# Patient Record
Sex: Male | Born: 1965 | Race: White | Hispanic: No | Smoking: Former smoker
Health system: Southern US, Community
[De-identification: ages and names within clinical notes are randomized; demographics above are authoritative.]

## PROBLEM LIST (undated history)

## (undated) DIAGNOSIS — I2510 Atherosclerotic heart disease of native coronary artery without angina pectoris: Secondary | ICD-10-CM

## (undated) DIAGNOSIS — I10 Essential (primary) hypertension: Secondary | ICD-10-CM

## (undated) DIAGNOSIS — Z8719 Personal history of other diseases of the digestive system: Secondary | ICD-10-CM

## (undated) DIAGNOSIS — F1910 Other psychoactive substance abuse, uncomplicated: Secondary | ICD-10-CM

## (undated) DIAGNOSIS — K219 Gastro-esophageal reflux disease without esophagitis: Secondary | ICD-10-CM

## (undated) DIAGNOSIS — G4733 Obstructive sleep apnea (adult) (pediatric): Secondary | ICD-10-CM

## (undated) DIAGNOSIS — E785 Hyperlipidemia, unspecified: Secondary | ICD-10-CM

## (undated) DIAGNOSIS — Z8659 Personal history of other mental and behavioral disorders: Secondary | ICD-10-CM

## (undated) HISTORY — DX: Atherosclerotic heart disease of native coronary artery without angina pectoris: I25.10

## (undated) HISTORY — DX: Personal history of other mental and behavioral disorders: Z86.59

## (undated) HISTORY — DX: Other psychoactive substance abuse, uncomplicated: F19.10

## (undated) HISTORY — DX: Essential (primary) hypertension: I10

## (undated) HISTORY — DX: Personal history of other diseases of the digestive system: Z87.19

## (undated) HISTORY — PX: CORONARY ARTERY BYPASS GRAFT: SHX141

## (undated) HISTORY — DX: Hyperlipidemia, unspecified: E78.5

---

## 2005-05-23 ENCOUNTER — Emergency Department: Payer: Self-pay | Admitting: Emergency Medicine

## 2005-05-23 ENCOUNTER — Other Ambulatory Visit: Payer: Self-pay

## 2005-06-12 ENCOUNTER — Inpatient Hospital Stay: Payer: Self-pay | Admitting: Internal Medicine

## 2005-06-12 ENCOUNTER — Other Ambulatory Visit: Payer: Self-pay

## 2005-08-15 ENCOUNTER — Ambulatory Visit: Payer: Self-pay | Admitting: Nurse Practitioner

## 2006-01-24 ENCOUNTER — Emergency Department: Payer: Self-pay | Admitting: Emergency Medicine

## 2006-03-25 ENCOUNTER — Ambulatory Visit: Payer: Self-pay | Admitting: Internal Medicine

## 2009-10-25 ENCOUNTER — Inpatient Hospital Stay: Payer: Self-pay | Admitting: Psychiatry

## 2010-01-16 ENCOUNTER — Emergency Department: Payer: Self-pay | Admitting: Unknown Physician Specialty

## 2010-04-18 ENCOUNTER — Inpatient Hospital Stay: Payer: Self-pay | Admitting: Internal Medicine

## 2010-05-04 ENCOUNTER — Ambulatory Visit: Payer: Self-pay | Admitting: Oncology

## 2010-06-04 ENCOUNTER — Ambulatory Visit: Payer: Self-pay | Admitting: Oncology

## 2011-02-19 ENCOUNTER — Emergency Department: Payer: Self-pay | Admitting: Emergency Medicine

## 2011-11-05 DIAGNOSIS — I2510 Atherosclerotic heart disease of native coronary artery without angina pectoris: Secondary | ICD-10-CM

## 2011-11-05 HISTORY — PX: CARDIAC CATHETERIZATION: SHX172

## 2011-11-05 HISTORY — DX: Atherosclerotic heart disease of native coronary artery without angina pectoris: I25.10

## 2011-12-06 ENCOUNTER — Emergency Department: Payer: Self-pay | Admitting: *Deleted

## 2011-12-06 LAB — TSH: Thyroid Stimulating Horm: 1.02 u[IU]/mL

## 2011-12-06 LAB — DRUG SCREEN, URINE

## 2011-12-06 LAB — CBC
HCT: 48.4 % (ref 40.0–52.0)
HGB: 17 g/dL (ref 13.0–18.0)
MCH: 33.5 pg (ref 26.0–34.0)
MCV: 96 fL (ref 80–100)
RBC: 5.07 10*6/uL (ref 4.40–5.90)
WBC: 9.7 10*3/uL (ref 3.8–10.6)

## 2011-12-06 LAB — COMPREHENSIVE METABOLIC PANEL
Alkaline Phosphatase: 94 U/L (ref 50–136)
Anion Gap: 13 (ref 7–16)
BUN: 4 mg/dL — ABNORMAL LOW (ref 7–18)
Bilirubin,Total: 0.3 mg/dL (ref 0.2–1.0)
Chloride: 103 mmol/L (ref 98–107)
Creatinine: 0.54 mg/dL — ABNORMAL LOW (ref 0.60–1.30)
EGFR (African American): >60
Osmolality: 288 (ref 275–301)
Potassium: 3.9 mmol/L (ref 3.5–5.1)
Sodium: 142 mmol/L (ref 136–145)
Total Protein: 8.6 g/dL — ABNORMAL HIGH (ref 6.4–8.2)

## 2011-12-06 LAB — URINALYSIS, COMPLETE
Leukocyte Esterase: NEGATIVE
Ph: 6 (ref 4.5–8.0)
Specific Gravity: 1.001 (ref 1.003–1.030)
Squamous Epithelial: NONE SEEN

## 2011-12-06 LAB — ETHANOL: Ethanol %: 0.295 % — ABNORMAL HIGH (ref 0.000–0.080)

## 2011-12-06 LAB — CK TOTAL AND CKMB (NOT AT ARMC)
CK, Total: 92 U/L (ref 35–232)
CK-MB: 0.9 ng/mL (ref 0.5–3.6)

## 2011-12-06 LAB — LIPASE, BLOOD: Lipase: 91 U/L (ref 73–393)

## 2011-12-16 ENCOUNTER — Emergency Department: Payer: Self-pay | Admitting: *Deleted

## 2011-12-16 LAB — COMPREHENSIVE METABOLIC PANEL
Alkaline Phosphatase: 99 U/L (ref 50–136)
BUN: 6 mg/dL — ABNORMAL LOW (ref 7–18)
Bilirubin,Total: 0.4 mg/dL (ref 0.2–1.0)
Calcium, Total: 9.1 mg/dL (ref 8.5–10.1)
Chloride: 94 mmol/L — ABNORMAL LOW (ref 98–107)
Co2: 30 mmol/L (ref 21–32)
EGFR (African American): >60
Glucose: 305 mg/dL — ABNORMAL HIGH (ref 65–99)
Osmolality: 281 (ref 275–301)
Potassium: 3.1 mmol/L — ABNORMAL LOW (ref 3.5–5.1)
SGOT(AST): 36 U/L (ref 15–37)
SGPT (ALT): 40 U/L
Sodium: 136 mmol/L (ref 136–145)
Total Protein: 9.2 g/dL — ABNORMAL HIGH (ref 6.4–8.2)

## 2011-12-16 LAB — DRUG SCREEN, URINE
Amphetamines, Ur Screen: NEGATIVE (ref ?–1000)
Barbiturates, Ur Screen: NEGATIVE (ref ?–200)
Benzodiazepine, Ur Scrn: NEGATIVE (ref ?–200)
Cannabinoid 50 Ng, Ur ~~LOC~~: NEGATIVE (ref ?–50)
Methadone, Ur Screen: NEGATIVE (ref ?–300)
Opiate, Ur Screen: NEGATIVE (ref ?–300)
Tricyclic, Ur Screen: NEGATIVE (ref ?–1000)

## 2011-12-16 LAB — LIPASE, BLOOD: Lipase: 108 U/L (ref 73–393)

## 2011-12-16 LAB — CBC
HCT: 52.2 % — ABNORMAL HIGH (ref 40.0–52.0)
HGB: 17.7 g/dL (ref 13.0–18.0)
MCH: 32.7 pg (ref 26.0–34.0)
MCHC: 33.9 g/dL (ref 32.0–36.0)
RDW: 14.9 % — ABNORMAL HIGH (ref 11.5–14.5)

## 2011-12-16 LAB — ACETAMINOPHEN LEVEL: Acetaminophen: <2 ug/mL

## 2011-12-16 LAB — SALICYLATE LEVEL: Salicylates, Serum: 3.6 mg/dL — ABNORMAL HIGH

## 2011-12-16 LAB — ETHANOL: Ethanol: 302 mg/dL

## 2012-05-19 ENCOUNTER — Observation Stay: Payer: Self-pay | Admitting: Internal Medicine

## 2012-05-19 LAB — DRUG SCREEN, URINE
Amphetamines, Ur Screen: NEGATIVE (ref ?–1000)
Barbiturates, Ur Screen: NEGATIVE (ref ?–200)
Benzodiazepine, Ur Scrn: NEGATIVE (ref ?–200)
Cannabinoid 50 Ng, Ur ~~LOC~~: NEGATIVE (ref ?–50)
Cocaine Metabolite,Ur ~~LOC~~: NEGATIVE (ref ?–300)
Methadone, Ur Screen: NEGATIVE (ref ?–300)
Opiate, Ur Screen: NEGATIVE (ref ?–300)
Phencyclidine (PCP) Ur S: NEGATIVE (ref ?–25)

## 2012-05-19 LAB — CK TOTAL AND CKMB (NOT AT ARMC)
CK, Total: 73 U/L (ref 35–232)
CK-MB: 0.9 ng/mL (ref 0.5–3.6)

## 2012-05-19 LAB — CBC
MCV: 100 fL (ref 80–100)
Platelet: 214 10*3/uL (ref 150–440)
RDW: 12.5 % (ref 11.5–14.5)
WBC: 10.5 10*3/uL (ref 3.8–10.6)

## 2012-05-19 LAB — COMPREHENSIVE METABOLIC PANEL
Alkaline Phosphatase: 64 U/L (ref 50–136)
Anion Gap: 11 (ref 7–16)
BUN: 15 mg/dL (ref 7–18)
Calcium, Total: 8.8 mg/dL (ref 8.5–10.1)
Chloride: 100 mmol/L (ref 98–107)
Co2: 25 mmol/L (ref 21–32)
EGFR (African American): >60
EGFR (Non-African Amer.): >60
Osmolality: 279 (ref 275–301)
Potassium: 4.1 mmol/L (ref 3.5–5.1)
SGOT(AST): 42 U/L — ABNORMAL HIGH (ref 15–37)
SGPT (ALT): 49 U/L
Total Protein: 6.9 g/dL (ref 6.4–8.2)

## 2012-05-20 DIAGNOSIS — R079 Chest pain, unspecified: Secondary | ICD-10-CM

## 2012-05-20 LAB — HEMOGLOBIN A1C: Hemoglobin A1C: 6.5 % — ABNORMAL HIGH (ref 4.2–6.3)

## 2012-05-20 LAB — TROPONIN I: Troponin-I: <0.02 ng/mL

## 2012-05-20 LAB — CK TOTAL AND CKMB (NOT AT ARMC)
CK, Total: 62 U/L (ref 35–232)
CK-MB: 0.6 ng/mL (ref 0.5–3.6)

## 2012-05-20 LAB — LIPID PANEL
Cholesterol: 273 mg/dL — ABNORMAL HIGH (ref 0–200)
Triglycerides: 2857 mg/dL — ABNORMAL HIGH (ref 0–200)

## 2012-05-21 LAB — COMPREHENSIVE METABOLIC PANEL
Albumin: 3.9 g/dL (ref 3.4–5.0)
Alkaline Phosphatase: 75 U/L (ref 50–136)
BUN: 9 mg/dL (ref 7–18)
Bilirubin,Total: 0.4 mg/dL (ref 0.2–1.0)
Chloride: 101 mmol/L (ref 98–107)
Co2: 22 mmol/L (ref 21–32)
Osmolality: 276 (ref 275–301)
SGOT(AST): 28 U/L (ref 15–37)
SGPT (ALT): 45 U/L

## 2012-05-21 LAB — TROPONIN I: Troponin-I: 0.4 ng/mL — ABNORMAL HIGH

## 2012-05-21 LAB — CBC
HCT: 48 % (ref 40.0–52.0)
HGB: 16.5 g/dL (ref 13.0–18.0)
MCH: 34.3 pg — ABNORMAL HIGH (ref 26.0–34.0)
MCHC: 34.4 g/dL (ref 32.0–36.0)
MCV: 100 fL (ref 80–100)
RBC: 4.81 10*6/uL (ref 4.40–5.90)

## 2012-05-22 ENCOUNTER — Inpatient Hospital Stay: Payer: Self-pay | Admitting: Internal Medicine

## 2012-05-22 DIAGNOSIS — R748 Abnormal levels of other serum enzymes: Secondary | ICD-10-CM

## 2012-05-22 DIAGNOSIS — I2510 Atherosclerotic heart disease of native coronary artery without angina pectoris: Secondary | ICD-10-CM

## 2012-05-22 DIAGNOSIS — R079 Chest pain, unspecified: Secondary | ICD-10-CM

## 2012-05-22 LAB — CK-MB: CK-MB: 7 ng/mL — ABNORMAL HIGH (ref 0.5–3.6)

## 2012-05-22 LAB — CK TOTAL AND CKMB (NOT AT ARMC)
CK, Total: 166 U/L (ref 35–232)
CK-MB: 15.4 ng/mL — ABNORMAL HIGH (ref 0.5–3.6)

## 2012-05-22 LAB — APTT: Activated PTT: 35.2 secs (ref 23.6–35.9)

## 2012-05-23 LAB — CBC WITH DIFFERENTIAL/PLATELET
Basophil #: 0.1 10*3/uL (ref 0.0–0.1)
Eosinophil %: 3.1 %
HCT: 40.7 % (ref 40.0–52.0)
HGB: 14.1 g/dL (ref 13.0–18.0)
Lymphocyte #: 1.9 10*3/uL (ref 1.0–3.6)
Lymphocyte %: 25.7 %
Monocyte #: 0.6 x10 3/mm (ref 0.2–1.0)
Monocyte %: 8.1 %
Neutrophil #: 4.4 10*3/uL (ref 1.4–6.5)
Platelet: 161 10*3/uL (ref 150–440)
RBC: 4.07 10*6/uL — ABNORMAL LOW (ref 4.40–5.90)
RDW: 12.4 % (ref 11.5–14.5)

## 2012-05-23 LAB — BASIC METABOLIC PANEL
BUN: 6 mg/dL — ABNORMAL LOW (ref 7–18)
Calcium, Total: 8.4 mg/dL — ABNORMAL LOW (ref 8.5–10.1)
Chloride: 103 mmol/L (ref 98–107)
Co2: 24 mmol/L (ref 21–32)
EGFR (African American): >60
EGFR (Non-African Amer.): >60
Glucose: 156 mg/dL — ABNORMAL HIGH (ref 65–99)
Potassium: 3.4 mmol/L — ABNORMAL LOW (ref 3.5–5.1)
Sodium: 137 mmol/L (ref 136–145)

## 2012-05-23 LAB — PROTIME-INR
INR: 0.8
Prothrombin Time: 11.6 secs (ref 11.5–14.7)

## 2012-06-26 ENCOUNTER — Observation Stay: Payer: Self-pay | Admitting: Internal Medicine

## 2012-06-26 DIAGNOSIS — R079 Chest pain, unspecified: Secondary | ICD-10-CM

## 2012-06-26 DIAGNOSIS — I517 Cardiomegaly: Secondary | ICD-10-CM

## 2012-06-26 LAB — CBC
MCH: 33.1 pg (ref 26.0–34.0)
MCV: 97 fL (ref 80–100)
Platelet: 415 10*3/uL (ref 150–440)
RBC: 4.17 10*6/uL — ABNORMAL LOW (ref 4.40–5.90)
RDW: 13.3 % (ref 11.5–14.5)

## 2012-06-26 LAB — CK TOTAL AND CKMB (NOT AT ARMC)
CK, Total: 62 U/L (ref 35–232)
CK, Total: 52 U/L (ref 35–232)
CK-MB: 1.2 ng/mL (ref 0.5–3.6)

## 2012-06-26 LAB — BASIC METABOLIC PANEL
Anion Gap: 10 (ref 7–16)
BUN: 9 mg/dL (ref 7–18)
Creatinine: 0.78 mg/dL (ref 0.60–1.30)
EGFR (Non-African Amer.): >60
Osmolality: 277 (ref 275–301)
Potassium: 3.9 mmol/L (ref 3.5–5.1)

## 2012-06-26 LAB — TROPONIN I: Troponin-I: <0.02 ng/mL

## 2012-06-29 ENCOUNTER — Encounter: Payer: Self-pay | Admitting: *Deleted

## 2012-06-29 ENCOUNTER — Encounter: Payer: Self-pay | Admitting: Cardiovascular Disease

## 2012-07-07 ENCOUNTER — Encounter: Payer: PRIVATE HEALTH INSURANCE | Admitting: Cardiovascular Disease

## 2012-08-06 ENCOUNTER — Encounter: Payer: Self-pay | Admitting: Cardiovascular Disease

## 2012-10-08 ENCOUNTER — Emergency Department: Payer: Self-pay | Admitting: Emergency Medicine

## 2012-11-14 ENCOUNTER — Emergency Department: Payer: Self-pay | Admitting: Emergency Medicine

## 2012-11-14 ENCOUNTER — Emergency Department: Payer: Self-pay | Admitting: Unknown Physician Specialty

## 2012-11-14 LAB — DRUG SCREEN, URINE
Amphetamines, Ur Screen: NEGATIVE (ref ?–1000)
Benzodiazepine, Ur Scrn: NEGATIVE (ref ?–200)
Cocaine Metabolite,Ur ~~LOC~~: NEGATIVE (ref ?–300)
Methadone, Ur Screen: NEGATIVE (ref ?–300)
Phencyclidine (PCP) Ur S: NEGATIVE (ref ?–25)
Tricyclic, Ur Screen: NEGATIVE (ref ?–1000)

## 2012-11-14 LAB — COMPREHENSIVE METABOLIC PANEL
Alkaline Phosphatase: 91 U/L (ref 50–136)
Anion Gap: 11 (ref 7–16)
BUN: 14 mg/dL (ref 7–18)
Chloride: 107 mmol/L (ref 98–107)
Co2: 23 mmol/L (ref 21–32)
Creatinine: 0.59 mg/dL — ABNORMAL LOW (ref 0.60–1.30)
EGFR (Non-African Amer.): >60
Glucose: 160 mg/dL — ABNORMAL HIGH (ref 65–99)
Osmolality: 285 (ref 275–301)
Potassium: 4 mmol/L (ref 3.5–5.1)
SGOT(AST): 27 U/L (ref 15–37)

## 2012-11-14 LAB — URINALYSIS, COMPLETE
Glucose,UR: NEGATIVE mg/dL (ref 0–75)
Leukocyte Esterase: NEGATIVE
Nitrite: NEGATIVE
RBC,UR: NONE SEEN /HPF (ref 0–5)
Specific Gravity: 1.021 (ref 1.003–1.030)
Squamous Epithelial: <1

## 2012-11-14 LAB — CBC
MCH: 31.6 pg (ref 26.0–34.0)
MCHC: 33.8 g/dL (ref 32.0–36.0)
MCV: 94 fL (ref 80–100)
RBC: 4.92 10*6/uL (ref 4.40–5.90)
RDW: 14 % (ref 11.5–14.5)
WBC: 15.7 10*3/uL — ABNORMAL HIGH (ref 3.8–10.6)

## 2012-11-14 LAB — TSH: Thyroid Stimulating Horm: 0.65 u[IU]/mL

## 2012-11-14 LAB — ETHANOL: Ethanol: 173 mg/dL

## 2013-06-27 ENCOUNTER — Observation Stay: Payer: Self-pay | Admitting: Internal Medicine

## 2013-06-27 LAB — DRUG SCREEN, URINE
Barbiturates, Ur Screen: NEGATIVE (ref ?–200)
Benzodiazepine, Ur Scrn: NEGATIVE (ref ?–200)
Cannabinoid 50 Ng, Ur ~~LOC~~: NEGATIVE (ref ?–50)
Cocaine Metabolite,Ur ~~LOC~~: NEGATIVE (ref ?–300)
MDMA (Ecstasy)Ur Screen: NEGATIVE (ref ?–500)
Methadone, Ur Screen: NEGATIVE (ref ?–300)
Tricyclic, Ur Screen: NEGATIVE (ref ?–1000)

## 2013-06-27 LAB — COMPREHENSIVE METABOLIC PANEL
Alkaline Phosphatase: 127 U/L (ref 50–136)
Anion Gap: 9 (ref 7–16)
BUN: 8 mg/dL (ref 7–18)
Bilirubin,Total: 0.5 mg/dL (ref 0.2–1.0)
Calcium, Total: 8.8 mg/dL (ref 8.5–10.1)
Chloride: 95 mmol/L — ABNORMAL LOW (ref 98–107)
EGFR (African American): >60
Glucose: 312 mg/dL — ABNORMAL HIGH (ref 65–99)
SGOT(AST): 21 U/L (ref 15–37)
Sodium: 128 mmol/L — ABNORMAL LOW (ref 136–145)

## 2013-06-27 LAB — CBC
HCT: 51.2 % (ref 40.0–52.0)
HGB: 18.2 g/dL — ABNORMAL HIGH (ref 13.0–18.0)
MCV: 91 fL (ref 80–100)
Platelet: 316 10*3/uL (ref 150–440)
RBC: 5.61 10*6/uL (ref 4.40–5.90)
RDW: 13.1 % (ref 11.5–14.5)
WBC: 18.9 10*3/uL — ABNORMAL HIGH (ref 3.8–10.6)

## 2013-06-27 LAB — URINALYSIS, COMPLETE
Bilirubin,UR: NEGATIVE
Blood: NEGATIVE
Glucose,UR: 300 mg/dL (ref 0–75)
Hyaline Cast: 2
Nitrite: NEGATIVE
RBC,UR: 1 /HPF (ref 0–5)
WBC UR: 3 /HPF (ref 0–5)

## 2013-06-27 LAB — ETHANOL
Ethanol %: <0.003 % (ref 0.000–0.080)
Ethanol: <3 mg/dL

## 2013-06-27 LAB — CK TOTAL AND CKMB (NOT AT ARMC)
CK, Total: 77 U/L (ref 35–232)
CK-MB: 1.4 ng/mL (ref 0.5–3.6)

## 2013-06-27 LAB — ACETAMINOPHEN LEVEL: Acetaminophen: <2 ug/mL

## 2013-06-28 LAB — CBC WITH DIFFERENTIAL/PLATELET
Basophil #: 0.1 10*3/uL (ref 0.0–0.1)
Basophil %: 1 %
Eosinophil %: 0.8 %
HCT: 46.8 % (ref 40.0–52.0)
Lymphocyte #: 2 10*3/uL (ref 1.0–3.6)
Lymphocyte %: 15.3 %
MCHC: 36 g/dL (ref 32.0–36.0)
MCV: 91 fL (ref 80–100)
Monocyte %: 6.1 %
Neutrophil #: 9.9 10*3/uL — ABNORMAL HIGH (ref 1.4–6.5)
Neutrophil %: 76.8 %
Platelet: 246 10*3/uL (ref 150–440)
RBC: 5.17 10*6/uL (ref 4.40–5.90)
WBC: 12.8 10*3/uL — ABNORMAL HIGH (ref 3.8–10.6)

## 2013-06-28 LAB — BASIC METABOLIC PANEL
Anion Gap: 9 (ref 7–16)
Calcium, Total: 8.6 mg/dL (ref 8.5–10.1)
Chloride: 98 mmol/L (ref 98–107)
Co2: 25 mmol/L (ref 21–32)
EGFR (African American): >60
EGFR (Non-African Amer.): >60
Osmolality: 275 (ref 275–301)
Sodium: 132 mmol/L — ABNORMAL LOW (ref 136–145)

## 2013-06-28 LAB — TROPONIN I
Troponin-I: 0.29 ng/mL — ABNORMAL HIGH
Troponin-I: 0.23 ng/mL — ABNORMAL HIGH

## 2013-06-28 LAB — CK TOTAL AND CKMB (NOT AT ARMC)
CK, Total: 53 U/L (ref 35–232)
CK-MB: 0.9 ng/mL (ref 0.5–3.6)

## 2014-04-30 LAB — URINALYSIS, COMPLETE
BILIRUBIN, UR: NEGATIVE
Bacteria: NONE SEEN
Blood: NEGATIVE
Hyaline Cast: 2
LEUKOCYTE ESTERASE: NEGATIVE
Nitrite: NEGATIVE
Ph: 5 (ref 4.5–8.0)
RBC,UR: 2 /HPF (ref 0–5)
Specific Gravity: 1.025 (ref 1.003–1.030)
Squamous Epithelial: NONE SEEN

## 2014-04-30 LAB — DRUG SCREEN, URINE

## 2014-04-30 LAB — CBC
HCT: 50.2 % (ref 40.0–52.0)
HGB: 16.7 g/dL (ref 13.0–18.0)
MCH: 30.9 pg (ref 26.0–34.0)
MCHC: 33.2 g/dL (ref 32.0–36.0)
MCV: 93 fL (ref 80–100)
PLATELETS: 226 10*3/uL (ref 150–440)
RBC: 5.4 10*6/uL (ref 4.40–5.90)
RDW: 13 % (ref 11.5–14.5)
WBC: 15.6 10*3/uL — AB (ref 3.8–10.6)

## 2014-04-30 LAB — COMPREHENSIVE METABOLIC PANEL
ALBUMIN: 4.6 g/dL (ref 3.4–5.0)
ALK PHOS: 78 U/L
ANION GAP: 11 (ref 7–16)
AST: 20 U/L (ref 15–37)
BUN: 11 mg/dL (ref 7–18)
Bilirubin,Total: 0.5 mg/dL (ref 0.2–1.0)
CHLORIDE: 104 mmol/L (ref 98–107)
CO2: 24 mmol/L (ref 21–32)
Calcium, Total: 9.1 mg/dL (ref 8.5–10.1)
Creatinine: 0.58 mg/dL — ABNORMAL LOW (ref 0.60–1.30)
EGFR (African American): >60
EGFR (Non-African Amer.): >60
Glucose: 214 mg/dL — ABNORMAL HIGH (ref 65–99)
OSMOLALITY: 283 (ref 275–301)
POTASSIUM: 3.5 mmol/L (ref 3.5–5.1)
SGPT (ALT): 40 U/L (ref 12–78)
Sodium: 139 mmol/L (ref 136–145)
Total Protein: 7.7 g/dL (ref 6.4–8.2)

## 2014-04-30 LAB — SALICYLATE LEVEL: Salicylates, Serum: 2.4 mg/dL

## 2014-04-30 LAB — ACETAMINOPHEN LEVEL: Acetaminophen: <2 ug/mL

## 2014-04-30 LAB — ETHANOL
ETHANOL LVL: 169 mg/dL
Ethanol %: 0.169 % — ABNORMAL HIGH (ref 0.000–0.080)

## 2014-05-02 ENCOUNTER — Inpatient Hospital Stay: Payer: Self-pay | Admitting: Internal Medicine

## 2014-05-02 LAB — BASIC METABOLIC PANEL
Anion Gap: 14 (ref 7–16)
BUN: 14 mg/dL (ref 7–18)
CALCIUM: 8.8 mg/dL (ref 8.5–10.1)
CO2: 26 mmol/L (ref 21–32)
Chloride: 98 mmol/L (ref 98–107)
Creatinine: 0.59 mg/dL — ABNORMAL LOW (ref 0.60–1.30)
EGFR (African American): >60
GLUCOSE: 397 mg/dL — AB (ref 65–99)
Osmolality: 293 (ref 275–301)
POTASSIUM: 4.2 mmol/L (ref 3.5–5.1)
SODIUM: 138 mmol/L (ref 136–145)

## 2014-05-02 LAB — TROPONIN I

## 2014-05-03 LAB — BASIC METABOLIC PANEL
Anion Gap: 6 — ABNORMAL LOW (ref 7–16)
BUN: 14 mg/dL (ref 7–18)
CALCIUM: 8.5 mg/dL (ref 8.5–10.1)
CHLORIDE: 105 mmol/L (ref 98–107)
CO2: 26 mmol/L (ref 21–32)
Creatinine: 0.69 mg/dL (ref 0.60–1.30)
EGFR (Non-African Amer.): >60
Glucose: 264 mg/dL — ABNORMAL HIGH (ref 65–99)
OSMOLALITY: 283 (ref 275–301)
Potassium: 4 mmol/L (ref 3.5–5.1)
SODIUM: 137 mmol/L (ref 136–145)

## 2014-05-03 LAB — LIPID PANEL
CHOLESTEROL: 105 mg/dL (ref 0–200)
HDL: 22 mg/dL — AB (ref 40–60)
Triglycerides: 409 mg/dL — ABNORMAL HIGH (ref 0–200)

## 2014-05-03 LAB — URINALYSIS, COMPLETE
BACTERIA: NONE SEEN
BLOOD: NEGATIVE
Bilirubin,UR: NEGATIVE
Glucose,UR: >=500 mg/dL (ref 0–75)
KETONE: NEGATIVE
Leukocyte Esterase: NEGATIVE
Nitrite: NEGATIVE
Ph: 6 (ref 4.5–8.0)
Protein: NEGATIVE
RBC,UR: NONE SEEN /HPF (ref 0–5)
Specific Gravity: 1.028 (ref 1.003–1.030)
Squamous Epithelial: NONE SEEN
WBC UR: NONE SEEN /HPF (ref 0–5)

## 2014-05-03 LAB — MAGNESIUM: MAGNESIUM: 1.9 mg/dL

## 2014-05-03 LAB — TSH: Thyroid Stimulating Horm: 3.49 u[IU]/mL

## 2014-05-04 LAB — PLATELET COUNT: Platelet: 205 x10 3/mm 3

## 2015-02-21 NOTE — Consult Note (Signed)
Brief Consult Note: Diagnosis: muscloskeletal chest pain, recent RCA PCI and CABG.   Patient was seen by consultant.   Consult note dictated.   Comments: The chest pain seems noncardiac. Will check an echo to make sure no pericardial effusion post CABG.  Given a recent drug eluting stent placement to RCA, I recommend resuming Plavix.  If echo is ok, the patient can be discharged home. [facility].  Electronic Signatures: Lorine BearsArida, Muhammad (MD)  (Signed 23-Aug-13 08:49)  Authored: Brief Consult Note   Last Updated: 23-Aug-13 08:49 by Lorine BearsArida, Muhammad (MD)

## 2015-02-21 NOTE — Discharge Summary (Signed)
PATIENT NAME:  Rodney [name], Rodney [name] MR#:  [id number] DATE OF BIRTH:  1965-12-12  DATE OF ADMISSION:  06/26/2012 DATE OF DISCHARGE:  06/26/2012  PRIMARY CARE PHYSICIAN: None local.   CONSULTING PHYSICIAN: Dr. Kirke CorinArida   DISCHARGE DIAGNOSES:  1. Atypical chest pain. 2. Coronary artery disease. 3. Hypertension.  4. Diabetes.   HOME MEDICATIONS:  1. Trazodone 50 mg p.o. daily at bedtime at bedtime. 2. Insulin 70/30 40 units sub-Q twice daily.  3. Gemfibrozil 600 mg p.o. b.i.[name]. before meals.  4. Hydralazine 50 mg p.o. 4 times a day.  5. Lisinopril 20 mg p.o. daily.  6. Atorvastatin 20 mg p.o. at bedtime.  7. Lopressor 50 mg p.o. b.i.[name].  8. Aspirin 81 mg p.o. 2 tablets p.o. once daily.   NEW MEDICATIONS:  1. Plavix 75 mg p.o. daily.  2. Lasix 20 mg p.o. daily.   DIET: Low sodium, low fat, low cholesterol ADA diet.   ACTIVITY: As tolerated.   FOLLOW-UP CARE:  1. Follow-up with PCP within 1 to 2 weeks.  2. Follow-up with Dr. Kirke CorinArida within 1 to 2 weeks.   HOSPITAL COURSE: The patient is a 49 year old gentleman with history of hypertension, diabetes, and coronary artery disease status post [facility] who presented to the ED with chest pain for 24 hours. The patient also had some mild leg edema. For a detailed history and physical examination, please refer to the admission note dictated by Dr. Tilda FrancoAkuneme.  On admission date, the patient's EKG showed sinus tachycardia at 110. White count 11, hemoglobin 14, BUN 9, creatinine 0.7, troponin less than 0.02 and the second one was less than 0.02.  Dr. Kirke CorinArida evaluated the patient and suggested echocardiogram which showed normal ejection fraction. Dr. Kirke CorinArida suggested the patient needs to take Plavix and may be discharged and [facility]. The patient had no chest pain after admission and no other symptoms. The patient's vital signs are stable. He is clinically stable and will be discharged to home today.   I  discussed the patient's discharge plan with the patient, nurse, case manager, and Dr. Kirke CorinArida.   TIME SPENT: About 37 minutes.  ____________________________ Shaune PollackQing Karthikeya Funke, MD qc:drc [name]: 06/26/2012 21:56:37 ET T: 06/27/2012 09:49:16 ET JOB#: [id number]  cc: Shaune PollackQing Musa Rewerts, MD, <Dictator> Shaune PollackQING Brayln Duque MD ELECTRONICALLY SIGNED 06/30/2012 16:44

## 2015-02-21 NOTE — H&P (Signed)
PATIENT NAME:  Rodney [name], Rodney [name] MR#:  [id number] DATE OF BIRTH:  1966-07-06  DATE OF ADMISSION:  06/26/2012  [facility]. ER PHYSICIAN: Dr. Manson PasseyBrown.  ADMITTING PHYSICIAN: Dr. Tilda FrancoAkuneme.    PRESENTING COMPLAINT: Chest pain.   HISTORY OF PRESENT ILLNESS: The patient is a 49 year old man who presented with complaint of chest pain that has been ongoing for the last 24 hours. Of note, the patient stated that he went to look over a camper he was supposed to buy and opened the camper, looked at a snake in there and jumped off the camper for safety. Since then he has been having recurrent chest pain, worse with lying down. He denies any loss of consciousness. No PND or orthopnea. Has mild leg swelling left lower extremity. No episodes of long distance travel or recent sick contacts. Of note, the patient recently, two weeks ago, had three-v[facility] and has been doing very well until episode of chest pain as stated earlier. One month ago had cardiac catheterization with drug-eluting stent placement in RCA. Denies any fever or PND, but has orthopnea. Unable to lie down due to chest pain and shortness of breath every time he lies down. The patient claims compliance with his medication. For this he was seen in the Emergency Room here and referred to the hospitalist for further evaluation. Work-up included an EKG which showed sinus arrhythmia and cardiac enzymes, which has been negative so far.   REVIEW OF SYSTEMS: CONSTITUTIONAL: Denies fever, weakness, weight loss or weight gain. EYES: No blurred vision, discharge, or redness. ENT: No tinnitus, epistaxis, difficulty swallowing or redness of oropharynx. RESPIRATORY: Admits to some cough. Denies any wheezing. Cough is nonproductive. Denies any shortness of breath. CARDIOVASCULAR: Positive for chest pain, sharp in nature, intensity 8/10, nonradiating. Denies any palpitations, syncope, arrhythmias,  exertional dyspnea. He has edema of left lower extremity from the venous graft access. GASTROINTESTINAL: No nausea, vomiting, diarrhea, abdominal pain, change in bowel habits. GU: No dysuria, frequency, or incontinence. ENDOCRINE: No polyuria, polydipsia, heat or cold intolerance. HEMATOLOGIC: No anemia, easy bruising, bleeding. No swollen glands. SKIN: No rashes, change in hair or skin texture. MUSCULOSKELETAL: Denies any joint pain, redness, swelling, but has tenderness anterior chest wall. NEUROLOGIC: No numbness, dizziness, memory loss, or headaches. PSYCHIATRIC: No anxiety or depression.   PAST MEDICAL HISTORY:  1. Type 2 diabetes.  2. History of depression.  3. Polysubstance abuse. 4. Prior history of noncompliance to medications.  5. History of coronary artery disease status post stent placement in RCA one month ago and status post coronary artery [facility].  6. Obstructive sleep apnea.  7. Hyperlipidemia.  8. Triglyceridemia with prior history of pancreatitis. 9. History of hypertension.   PAST SURGICAL HISTORY:  1. Cardiac stent placement in 2006. A second stent placement and cardiac catheterization in July 2013. Drug-eluting stent placed in 90% RCA lesion. During that time he was noted to have moderate LAD lesion of 50-60% and subsequently two weeks ago had coronary artery [facility]  2. Prior history of abdominal surgery, cannot recall for what.   SOCIAL HISTORY: Lives at home. Reformed tobacco abuser, quit about four weeks ago, 30 pack-year history. He denies any other recreational drug use.   FAMILY HISTORY: Positive for diabetes in both parents. Alcohol abuse in the father.   ALLERGIES: Zinc including insulin.   MEDICATIONS:  1. Aspirin 81 mg 2  tablets once daily.  2. Lisinopril 20 mg once daily.  3. Trazodone 50 mg at bedtime. 4. Insulin 70/30, forty units subcutaneous twice a day. 5. Atorvastatin 20 mg at  bedtime. 6. Gemfibrozil 600 mg twice daily.  7. Lopressor 50 mg twice a day. 8. Hydralazine 50 mg 4 times daily.   PHYSICAL EXAMINATION:  VITAL SIGNS: Temperature 98.7, pulse 114 on arrival, now is 85, respiratory rate 22. Blood pressure on arrival 175/86, now is 144/95, oxygen saturation 95% on room air.   GENERAL: Overweight male sitting up on gurney, awake, alert, oriented to time, place, and person, in no overt distress.   HEENT: Atraumatic, normocephalic. Pupils equal, reactive to light and accommodation. Extraocular movement intact. Mucous membranes pink, moist.   NECK: Supple. No JV distention.   CHEST: Good air entry. Generalized rhonchi, no rales. Tenderness anterior chest wall, mostly left-sided.  HEART: Regular rate and rhythm No murmurs.   ABDOMEN: Full, moves with respiration, nontender. Bowel sounds normoactive. No organomegaly.   EXTREMITIES: Edema left lower extremity. Normal range of movement.   NEUROLOGIC: Cranial nerves II through XII grossly intact. No focal motor or sensory deficits.   PSYCH: Affect appropriate to situation.   SKIN: He has median sternotomy scar on his anterior chest wall and laparoscopic scars upper quadrants of the abdomen.   LABORATORY, DIAGNOSTIC, AND RADIOLOGICAL DATA: EKG shows sinus tachycardia, rate of 110. multifocal atrial tachycardia with P wave inversions. There are no T wave changes. Repeat EKG shows sinus arrhythmia, rate of 70. CBC: White count 11, hemoglobin approximately 14, platelets 415. Chemistry: Sodium 134, potassium 3.9, creatinine 0.7, BUN 9, glucose 287, calcium 9. CK 62. Troponin negative.   IMPRESSION:  1. Chest pain, rule out ACS versus arrhythmias, most likely musculoskeletal. Could consider post sternotomy syndrome.  2. History of coronary artery disease, status post recent coronary artery bypass graft two weeks ago and recent stent one month ago noted.  3. History of hypertension.  4. Type 2 diabetes, poorly  controlled.  5. Leukocytosis, not otherwise specified, likely reactive.  6. Depression, stable.  7. Tobacco misuse, now discontinued.  8. Hyperlipidemia.  9. Obstructive sleep apnea.   PLAN:  1. Admit to general medical floor for serial cardiac enzymes, telemonitoring, for respiratory support with oxygen, nebulizer.  2. Cardiology evaluation in a.m.  3. Gastrointestinal prophylaxis with Protonix.  4. Deep vein thrombosis prophylaxis with Lovenox.   CODE STATUS: FULL CODE.   TOTAL PATIENT CARE TIME: 50 minutes.   ____________________________ Floy Sabina Tilda Franco, MD mia:ap [name]: 06/26/2012 04:05:15 ET T: 06/26/2012 07:34:29 ET JOB#: [id number]  cc: Milas Schappell I. Tilda Franco, MD, <Dictator> [facility] Margaret Pyle MD ELECTRONICALLY SIGNED 06/27/2012 1:50

## 2015-02-21 NOTE — Discharge Summary (Signed)
PATIENT NAME:  Dionne BucyVINCENT, Tahmir D MR#:  [id number] DATE OF BIRTH:  [birth year]  DATE OF ADMISSION:  05/19/2012 DATE OF DISCHARGE:  05/20/2012  PRIMARY CARE PHYSICIAN:  [facility]- he does not know his physician's name.  FINAL DIAGNOSES:  1. Chest pain.  2. History of coronary artery disease.  3. Malignant hypertension.  4. Diabetes.  5. Hypertriglyceridemia.  6. Bronchitis.  MEDICATIONS ON DISCHARGE:  1. Thiamine 100 mg daily.  2. Trazodone 50 mg at bedtime.  3. Xanax 0.5 mg twice a day as needed for anxiety.  4. Lopressor 50 mg twice a day. 5. Aspirin 325 mg daily.  6. Lisinopril 20 mg daily.  7. 70/30 insulin, 40 units subcutaneous injection twice a day. 8. Gemfibrozil 600 mg twice a day.  9. Plavix 75 mg daily.  10. Zithromax 250 mg, 1 tablet daily for four more days, then stop.  11. Hydralazine 50 mg 4 times a day.   DIET: Low sodium diet, low fat diet, low cholesterol diet, regular consistency.   ACTIVITY: As tolerated.   FOLLOWUP: [facility].   The patient was admitted 05/19/2012, discharged 05/20/2012.   CHIEF COMPLAINT: Chest pain.   HISTORY OF PRESENT ILLNESS: The patient is a 49 year old man with hypertension and diabetes who came in with chest pain, had a stent placed in 2006. Chest pain lasted an hour, 7 out of 10 in intensity, pressure-like. He was admitted to the hospital. Cardiac enzymes were ordered. Stress test was ordered. For his malignant hypertension he was restarted on beta blocker, hydralazine, and switched over to lisinopril.   LABORATORY, DIAGNOSTIC, AND RADIOLOGICAL DATA: EKG showed sinus tachycardia, poor R-wave progression, flattening of T waves laterally.  Glucose 209, BUN 15, creatinine 0.55, sodium 136, potassium 4.1, chloride 100, CO2 25, calcium 8.8. Liver function tests: AST slightly elevated at 42. Troponin negative. White blood cell count 10.5, hemoglobin and hematocrit 15.8 and 45.1, platelet count of 214. Chest x-ray:  Mild  interstitial prominence, mild interstitial edema or atypical infection are considerations.  Urine toxicology negative. Troponin negative. Triglycerides 2857, HDL 22, hemoglobin A1c 6.5, third troponin negative. Stress test showed ejection fraction of 47%. No significant ischemia. Old scar apical region. Normal wall motion. Low risk scan.   The patient was discharged home in stable condition.   HOSPITAL COURSE PER PROBLEM LIST:  1. Chest pain: The patient had no further chest pain. Stress test was negative. Cardiac enzymes were negative. The patient was discharged home, kept on his aspirin, Plavix, and metoprolol.  2. History of coronary artery disease: Kept on aspirin, Plavix, and metoprolol.  3. Malignant hypertension: Blood pressure was variable during the hospital course. Blood pressure 133/83 upon discharge. He was kept on his metoprolol, hydralazine, and lisinopril.  4. Diabetes: He was put back on his 70/30 insulin, 40 units subcutaneous injection twice a day.  5. Hypertriglyceridemia: He was prescribed gemfibrozil. Triglycerides greater than 2800.  6. Bronchitis: Dr. Luberta MutterKonidena put the patient on a Z-Pak. Will continue and finish out the course.   TIME SPENT ON DISCHARGE: 35 minutes.   ____________________________ Herschell Dimesichard J. Renae GlossWieting, MD rjw:bjt D:  05/20/2012 16:01:10 ET         T: 05/21/2012 11:20:39 ET         JOB#: [id number]  cc: [facility] Salley ScarletICHARD J Akane Tessier MD ELECTRONICALLY SIGNED 05/23/2012 16:04

## 2015-02-21 NOTE — H&P (Signed)
PATIENT NAME:  Rodney BucyVINCENT, Rodney [name] MR#:  [id number] DATE OF BIRTH:  03/08/1966  DATE OF ADMISSION:  05/22/2012  PRIMARY CARE PHYSICIAN: None  ER PHYSICIAN: Dr. Bayard Malesandolph Brown  CHIEF COMPLAINT: Chest pain.   HISTORY OF PRESENT ILLNESS: 49 year old male patient with history of hypertension, diabetes who recently had a stress test done on 05/20/2012 for a similar chest pain which was retrosternal, presents to the Emergency Room with the same complaints. Patient has history of coronary artery disease with his last cardiac catheterization and stent placed in 2006. Patient had this chest pain start three days prior which was retrosternal, pressure-like and presented to the Emergency Room for the same. Today this pain was worse, radiation to the neck and he presented to the Emergency Room again, was found to have troponin of 0.4. This is not associated with any shortness of breath, palpitations, lightheadedness but does have significant diaphoresis. No nausea, vomiting, abdominal pain or any bleeding.   His stress test done on 05/20/2012 showed no significant ischemia, thinning of the apical region with possible old scar versus attenuation defect, 47% ejection fraction and no EKG changes concerning for ischemia.   PAST MEDICAL HISTORY: 1. Hypertension. 2. Diabetes. 3. Hypertriglyceridemia. 4. Diabetic neuropathy. 5. Coronary artery disease with stent placed in 2006. 6. Pancreatitis. 7. Tobacco abuse   ALLERGIES: Zinc and zinc-containing medications including insulin.   SOCIAL HISTORY: Patient has a 30 pack-year smoking history, is cutting down his smoking at this time. No alcohol. No drugs.   PAST SURGICAL HISTORY:  1. Cardiac catheterization. 2. Abdominal surgery.   FAMILY HISTORY: Diabetes in mother and father. His father also had problems with alcoholism.   DISCHARGE MEDICATIONS:  1. Aspirin 81 mg oral once a day.  2. Plavix 75 mg oral once a day. 3. Nexium 100 mg oral once a day.   4. Lopressor 50 mg oral 2 times a day.  5. Alprazolam 0.5 mg oral 2 times a day.  6. Erythromycin 250 mg oral once a day.  7. Gemfibrozil 600 mg oral once a day.  8. Hydralazine 50 mg oral 4 times a day.  9. Insulin 70/30, 40 units twice a day.  10. Lisinopril 20 mg oral once a day.  11. Trazodone 50 mg oral once a day.   REVIEW OF THE SYSTEMS: CONSTITUTIONAL: No fever, fatigue or weakness. EYES: No blurred vision or redness. ENT no tinnitus, ear pain, hearing loss. RESPIRATORY: No cough. No wheezing. No hemoptysis. CARDIOVASCULAR: Chest pain present. No orthopnea or edema. GASTROINTESTINAL: No nausea, vomiting, diarrhea, abdominal pain. GENITOURINARY: No dysuria, hematuria. ENDOCRINE: No polyuria or nocturia. HEME/LYMPH: No anemia, easy bruising or bleeding. SKIN: No rash, ulcers. MUSCULOSKELETAL: No joint swelling, redness, effusion. NEUROLOGICAL: No numbness or focal weakness. PSYCHIATRIC: Does have problems with anxiety and panic attacks.   PHYSICAL EXAMINATION:  VITAL SIGNS: Blood pressure 175/103, pulse 98, temperature 98.2, respirations 18, saturating 98% on room air.   GENERAL: Obese Caucasian male patient sitting up in bed comfortable, in no acute distress.   PSYCHIATRIC: Alert and oriented x3. Mood and affect appropriate. Judgment intact.   HEENT: Atraumatic, normocephalic. Oral mucosa moist and pink. External ears and nose normal. No pallor. No icterus. Pupils bilaterally equal and reactive to light.   NECK: Supple. No thyromegaly. No palpable lymph nodes. Trachea midline. No carotid bruit or JVD.   CARDIOVASCULAR: S1, S2, regular rate and rhythm without any murmurs. PMI not displaced. No chest wall tenderness.   RESPIRATORY: No work of breathing. Clear to  auscultation on both sides. Normal percussion.   GASTROINTESTINAL: Soft abdomen, nontender. Bowel sounds present. No hepatosplenomegaly or hernias.   SKIN: Warm and dry. No petechiae, rash, ulcers.   EXTREMITIES: No  cyanosis, clubbing, edema.   NEUROLOGICAL: Motor strength 5/5 in upper and lower extremities. Sensation to fine touch intact all over again. Cranial nerves II through XII intact.   LABORATORY, DIAGNOSTIC AND RADIOLOGICAL DATA: Laboratory studies show glucose 166. BUN, creatinine, sodium, potassium normal with recent triglycerides of 2857, troponin 0.40, CK and MB not done.   Hemoglobin 16.5, WBC 9.2, platelets 207.   EKG shows sinus tachycardia of 115 with Q waves in the inferior leads. No acute ST-T wave changes.   ASSESSMENT AND PLAN:  1. Chest pain, typical. Patient does have chest pain retrosternally radiating to his neck and troponin of 0.4. Recent stress test was negative but has significant risk factors with prior coronary artery disease, diabetes, hypertension, and uncontrolled hypertension along with tobacco abuse. Discussed case with Dr. Mariah Milling. Patient has received one dose of Lovenox at this time. Will monitor closely on telemetry floor. He is high risk for deterioration. Patient will be bedrest. Continue the beta blocker, ACE, Plavix, aspirin and statin. Will check two more sets of cardiac enzymes and repeat an EKG in the morning. Patient will likely need a cardiac catheterization.  2. Uncontrolled hypertension. Presently mildly elevated at 145/90 although at presentation patient was at 180. Will use p.r.n. labetalol.  3. Insulin-dependent diabetes mellitus. Patient will be n.p.o. for likely catheterization in the morning. Will use sliding scale insulin.  4. Tobacco abuse. Patient has been counseled for more than three minutes regarding quitting smoking. He does mention that he is cutting down on the number of cigarettes at this time and is determined to quit at this time. Nicotine patch offered.   TIME SPENT: Time spent today on this case in coordination of care and discussing the plan with the patient and reviewing old records was 70 minutes.   ____________________________ Molinda Bailiff  Lou Loewe, MD srs:cms [name]: 05/22/2012 00:57:11 ET T: 05/22/2012 09:19:37 ET JOB#: [id number]  cc: Wardell Heath R. Elpidio Anis, MD, <Dictator> Antonieta Iba, MD  Orie Fisherman MD ELECTRONICALLY SIGNED 06/03/2012 13:47

## 2015-02-21 NOTE — Consult Note (Signed)
General Aspect 49 year old male with history of hypertension, diabetes, smoking, CAD with stent, presenting with chest pain. Cardiology was consulted for elevated cardiac enz.  Patient has history of coronary artery disease and stent placed to the RCA in  in 2006.   Patient developed chest pain started this morning around 6:00 which was like pressure-like pain on the left side, 7/10 in severity, came on at rest. Received some nitroglycerin in the ER with some relief. Also has some cough. No nausea. No vomiting. No diaphoresis. No trouble breathing and no syncope. Patient denies any fever. No exertional dyspnea. he reports that he left the ER because he felt well. The ER called him with lab results and suggested he come back to the hospital.  Recent stress test showed no large regions of ischemia, small region of fixed defect in the apical region.    PAST MEDICAL HISTORY: 1. Hypertension. 2. Diabetes. 3. Hyperlipidemia. 4. History of diabetic neuropathy.  5. History of coronary artery disease with stent placement to RCA in 2006 6. History of pancreatitis.   ALLERGIES: Zinc and zinc-containing medications including insulin.   SOCIAL HISTORY: Patient  history of smoking, used to smoke about 3 cigarettes a day. No alcohol. No drugs. Alcohol abuse. Patient was evaluated in the ER by psychiatry for alcohol detox.   PAST SURGICAL HISTORY:  1. History of coronary artery disease stenting.  2. Abdominal surgery.   FAMILY HISTORY: Family history of diabetes to mother and father and father has history of alcoholism.   Physical Exam:   GEN well developed, well nourished, no acute distress    HEENT red conjunctivae    NECK supple    RESP normal resp effort  clear BS    CARD Regular rate and rhythm  No murmur    ABD denies tenderness  soft    LYMPH negative neck    EXTR negative edema    SKIN normal to palpation    NEURO cranial nerves intact, motor/sensory function intact     PSYCH alert, A+O to time, place, person, good insight   Review of Systems:   Subjective/Chief Complaint Chest pain    Skin: No Complaints    ENT: No Complaints    Eyes: No Complaints    Neck: No Complaints    Respiratory: No Complaints    Cardiovascular: No Complaints    Gastrointestinal: No Complaints    Genitourinary: No Complaints    Vascular: No Complaints    Musculoskeletal: No Complaints    Neurologic: No Complaints    Hematologic: No Complaints    Endocrine: No Complaints    Psychiatric: No Complaints    Review of Systems: All other systems were reviewed and found to be negative    Medications/Allergies Reviewed Medications/Allergies reviewed       Diabetes:    Depression:    Pancreatitis:    polysubstance abuse:    MI - Myocardial Infarct:    Sleep Apnea:    CAD:    Hyperlipidemia:    HTN:    Tracheostomy:    Cardiac Stents:          Admit Diagnosis:   AMI: 22-May-2012, Active, AMI  Home Medications:  Medication Instructions Status  hydrALAZINE 50 mg oral tablet 1 tab(s) orally 4 times a day Active  azithromycin 250 mg oral tablet 1 tab(s) orally once a day Active  clopidogrel 75 mg oral tablet 1 tab(s) orally once a day Active  gemfibrozil 600 mg oral tablet 1 tab(s)  orally 2 times a day (before meals) Active  insulin isophane-insulin regular 70 units-30 units/mL human recombinant subcutaneous suspension 40 unit(s) subcutaneous twice a day Active  lisinopril 20 mg oral tablet 1 tab(s) orally once a day Active  aspirin 325 mg oral delayed release tablet 1 tab(s) orally once a day Active  thiamine 100 mg oral tablet 1  orally once a day  Active  trazodone 50 mg oral tablet 1  orally once a day (at bedtime)  Active  alprazolam 0.5 mg oral tablet 1  orally 2 times a day  Active  Lopressor 50 mg oral tablet 1  orally 2 times a day  Active   Lab Results:  Routine Chem:  19-Jul-13 04:04    Result Comment TOTAL CK - Slight  hemolysis, interpret results with  - caution.  Result(s) reported on 22 May 2012 at 05:00AM.  Cardiac:  19-Jul-13 04:04    CK, Total 166   CPK-MB, Serum  15.4   Troponin I  3.20 (0.00-0.05 0.05 ng/mL or less: NEGATIVE  Repeat testing in 3-6 hrs  if clinically indicated. >0.05 ng/mL: POTENTIAL  MYOCARDIAL INJURY. Repeat  testing in 3-6 hrs if  clinically indicated. NOTE: An increase or decrease  of 30% or more on serial  testing suggests a  clinically important change)   EKG:   Interpretation NSR with nonspecific ST ABN    zinc sulfate containing compounds: Rash  Vital Signs/Nurse's Notes:  **Vital Signs.:   19-Jul-13 03:45   Vital Signs Type Routine   Temperature Temperature (F) 97.8   Celsius 36.5   Temperature Source Oral   Pulse Pulse 89   Respirations Respirations 22   Systolic BP Systolic BP 152   Diastolic BP (mmHg) Diastolic BP (mmHg) 97   Mean BP 115   Pulse Ox % Pulse Ox % 95   Oxygen Delivery Room Air/ 21 %     Impression 49 year old male with history of hypertension, diabetes, smoking, CAD with stent, presenting with chest pain. Cardiology was consulted for elevated cardiac enz.  Patient has history of coronary artery disease and stent placed to the RCA in  in 2006.   1) NSTEMI Elevated cardiac enz,  will plan cardiac cath today on asa, b-blocker,  NTG paste held for headache Lovenox given last night, now held for cardiac cath  2) SMoking: encourage smoking cessation  3) hyperlipidemia: will need to stay on statin  4) HTN: resume outpt meds, could hold ACE until after cath  5) Diabetes: encourage diet compliance   Electronic Signatures: Julien NordmannGollan, Timothy (MD)  (Signed 19-Jul-13 08:16)  Authored: General Aspect/Present Illness, History and Physical Exam, Review of System, Past Medical History, Health Issues, Home Medications, Labs, EKG , Allergies, Vital Signs/Nurse's Notes, Impression/Plan   Last Updated: 19-Jul-13 08:16 by Julien NordmannGollan, Timothy  (MD)

## 2015-02-21 NOTE — H&P (Signed)
PATIENT NAME:  Rodney BucyVINCENT, Rodney [name] MR#:  [id number] DATE OF BIRTH:  Dec 17, 1965  DATE OF ADMISSION:  05/19/2012  PRIMARY CARE PHYSICIAN: None  ER PHYSICIAN: Dr. Mindi JunkerGottlieb  CHIEF COMPLAINT: Chest pain.   HISTORY OF PRESENT ILLNESS: Patient is a 49 year old male with history of hypertension, diabetes came in because of chest pain. Patient has history of coronary artery disease and stent placed in 2006. Patient says the chest pain started this morning around 6:00 which was like pressure-like pain on the left side, 7/10 in severity. Received some nitroglycerin in the ER with some relief. Also has some cough. No nausea. No vomiting. No diaphoresis. No trouble breathing and no syncope. Patient denies any fever. No exertional dyspnea.   PAST MEDICAL HISTORY: 1. Hypertension. 2. Diabetes. 3. Hyperlipidemia. 4. History of diabetic neuropathy.  5. History of coronary artery disease with stent placement.  6. History of pancreatitis.   ALLERGIES: Zinc and zinc-containing medications including insulin.   SOCIAL HISTORY: Patient  history of smoking, used to smoke about 3 cigarettes a day. No alcohol. No drugs. Alcohol abuse. Patient was evaluated in the ER by psychiatry for alcohol detox.   PAST SURGICAL HISTORY:  1. History of coronary artery disease stenting.  2. Abdominal surgery.   FAMILY HISTORY: Family history of diabetes to mother and father and father has history of alcoholism.   MEDICATIONS:  1. Xanax 0.5 p.o. b.i.[name].  2. Aspirin. Patient does not know how much he takes. 3. Folic acid 1 mg daily. 4. Lopressor 50 mg p.o. b.i.[name].  5. Magnesium oxide 400 mg b.i.[name].   6. Plavix, thiamine, trazodone and TriCor but these doses are not mentioned.   REVIEW OF SYSTEMS: CONSTITUTIONAL: Has no fatigue. No fever. CARDIOVASCULAR: Has chest pain. RESPIRATORY: Has no trouble breathing but has cough. GASTROINTESTINAL: No nausea. No vomiting. No diarrhea. MUSCULOSKELETAL: No joint pains. NEUROLOGIC: Has  neuropathy related to diabetes. EXTREMITIES: Patient has no extremity edema.   PHYSICAL EXAMINATION: VITAL SIGNS: Temperature 97.9, pulse 93, respirations 20, blood pressure 203/126, sats 96% on room air.   GENERAL: Alert, awake, oriented moderately nourished male with no distress, answering questions appropriately.   HEENT: Head atraumatic, normocephalic. Pupils equally reacting to light. Extraocular movements are intact.   ENT: No tympanic membrane congestion. No turbinate hypertrophy. No oropharyngeal erythema.   NECK: Normal range of motion. No JVD. No carotid bruits.   CARDIOVASCULAR: S1, S2 regular. No murmurs. PMI not displaced. No chest wall tenderness.   LUNGS: Clear to auscultation. No wheeze. No rales.   ABDOMEN: Soft, nontender, nondistended. Bowel sounds present.   EXTREMITIES: No extremity edema. No cyanosis. No clubbing.   NEUROLOGIC: Patient has no neurological deficit. Cranial nerves II through XII intact. Power 5/5 in upper and lower extremities. Sensation intact. DTRs 2+ bilaterally.   PSYCH: Mood and affect are within normal limits.   LABORATORY, DIAGNOSTIC AND RADIOLOGICAL DATA: Urinalysis is negative. Urine toxicology negative. Glucose 194. Chest x-ray showed mild interstitial prominence, mild interstitial edema, atypical infection.   WBC 10.3, hemoglobin 15.8, hematocrit 45.9, platelets 214, troponin less than 0.02.   Electrolytes: Sodium 137, potassium 4.1, chloride 100, bicarbonate 25, BUN 15, creatinine 0.55, glucose 209.   Troponins as I mentioned less than 0.02. EKG showed sinus tach at 101 beats per minute and no ST-T changes.   ASSESSMENT AND PLAN: Patient is a 49 year old male with:  1. Chest pain. Symptoms concerning for acute unstable angina. Patient is on aspirin, beta blockers, Plavix, statins ACE inhibitors Lovenox and possible  stress test in the morning if troponins were negative. Continue nitro paste.  2. Malignant hypertension, hypertensive  crisis. Patient's blood pressure is very elevated and question about medication dosing issue and compliance. Patient is already on beta blocker and hydralazine had been added to his medications. Continue enalapril 20 mg daily and hydralazine 50 mg q.i.[name]. and also he received labetalol 20 mg IV in the ER. Continue to monitor on telemetry and adjust the medications further. Stress test in the morning.  3. Diabetes mellitus, type 2 We will do sliding scale with coverage and also get hemoglobin A1c in the morning.  4. Hyperlipidemia. He is on TriCor but does not know the dose so will start on simvastatin and check the lipase in the morning.   TIME SPENT ON HISTORY AND PHYSICAL: About 60 minutes.   Discussed the plan with the patient, answered all the questions  ____________________________ Katha Hamming, MD sk:cms [name]: 05/19/2012 13:55:59 ET T: 05/19/2012 14:16:53 ET  JOB#: [id number] cc: Katha Hamming, MD, <Dictator> Katha Hamming MD ELECTRONICALLY SIGNED 06/08/2012 21:52

## 2015-02-21 NOTE — Consult Note (Signed)
PATIENT NAME:  Rodney [name], Rodney [name] MR#:  [id number] DATE OF BIRTH:  1966/08/03  DATE OF CONSULTATION:  06/26/2012  REFERRING PHYSICIAN:   CONSULTING PHYSICIAN:  Bryne Lindon A. Kirke Corin, MD  REASON FOR CONSULTATION: Chest pain.   HISTORY OF PRESENT ILLNESS: This is a 49 year old man with known history of coronary artery disease. The patient presented last month with unstable angina. He underwent cardiac catheterization on 07/19 which showed significant proximal RCA disease and moderate to borderline significant disease in the left anterior descending artery and left circumflex artery. He had an angioplasty and drug-eluting stent placement to the proximal RCA. The patient continued to have chest pain after the hospital discharge and went to the Emergency Room at [facility]. It appears that he underwent coronary artery bypass graft surgery at that time to revascularize the left system. The patient was discharged home without complications. He continued to have chest discomfort at the surgical site. The patient went to look at a camper and while he was there, there was a copperhead snake and he jumped very suddenly and felt discomfort in his chest at that time and continued to have chest pain at the surgical site mostly. He came to the Emergency Room. He was slightly tachycardic. He is feeling better today and back to his baseline.   PAST MEDICAL HISTORY:  1. Type 2 diabetes.  2. Depression.  3. History of polysubstance abuse.  4. History of coronary artery disease status post drug-eluting stent placement to the RCA followed by coronary artery bypass graft surgery two weeks ago at [facility] to revascularize the left system.  5. Obstructive sleep apnea.  6. Hyperlipidemia.  7. Hypertension.   SOCIAL HISTORY: The patient is currently unemployed. He is a former tobacco user. He denies any current recreational drug use.   FAMILY HISTORY: Family history is remarkable for diabetes.   ALLERGIES: Zinc, insulin.    HOME MEDICATIONS:  1. Aspirin 81 mg daily.  2. Lisinopril 20 mg daily.  3. Trazodone 50 mg at bedtime.  4. Atorvastatin 20 mg at bedtime.  5. Gemfibrozil 600 mg twice a day.  6. Metoprolol 50 mg twice daily.  7. Hydralazine 50 mg 4 times daily.   PHYSICAL EXAMINATION:   GENERAL: The patient appears his stated age in no acute distress.   VITAL SIGNS: Temperature 98.3, pulse 85, respiratory rate 18, blood pressure 126/85, and oxygen saturation is 95% on room air.   HEENT: Normocephalic, atraumatic.   NECK: No jugular venous distention or carotid bruits.   RESPIRATORY: Normal respiratory effort with no use of accessory muscles. Auscultation reveals normal breath sounds.   CARDIOVASCULAR: Normal PMI. Normal S1 and S2 with no gallops or murmurs. The patient is very tender at the surgical site as well as the left side. He has reproducible chest pain.   ABDOMEN: Benign, nontender, and nondistended.   EXTREMITIES: No clubbing, cyanosis, or edema.   SKIN: Warm and dry with no rash.   PSYCHIATRIC: He is alert, oriented times three with normal mood and affect.   LABORATORY, DIAGNOSTIC, AND RADIOLOGICAL DATA: His labs were overall unremarkable. Cardiac enzymes were negative. D-dimer was elevated at 2.06. He had a CT scan done which showed no evidence of pulmonary embolism. There was a small fluid collection in the suprasternal region. EKG showed sinus tachycardia with anterior T wave changes.   IMPRESSION:  1. Musculoskeletal chest pain post CABG surgery.  2. Coronary artery disease status post drug-eluting stent placement to the RCA in July 2013 followed  by coronary artery bypass graft surgery to the left system.  3. Hypertension.  4. Hyperlipidemia.  5. Type 2 diabetes.   RECOMMENDATIONS: The patient's current chest pain does not seem to be cardiac. It is more suggestive of musculoskeletal etiology. CT scan did show a small fluid collection containing air in the suprasternal region.  This has to be monitored closely by cardiothoracic surgery [facility]. It is possible that this is the etiology of his chest pain. I will request an echocardiogram to make sure he has no pericardial effusion. If he rules out for myocardial infarction and the echocardiogram is unremarkable, the patient can be discharged home from a cardiac standpoint. Given that he had drug-eluting stent placement to the RCA last month, I recommend resuming treatment with Plavix.    ____________________________ Chelsea AusMuhammad A. Kirke CorinArida, MD maa:bjt D: 06/26/2012 08:56:55 ET T: 06/26/2012 09:35:32 ET JOB#: [id number]  cc: Jerolyn CenterMuhammad A. Kirke CorinArida, MD, <Dictator> Iran OuchMUHAMMAD A Synai Prettyman MD ELECTRONICALLY SIGNED 07/24/2012 16:32

## 2015-02-21 NOTE — Discharge Summary (Signed)
PATIENT NAME:  Rodney [name], Cort D MR#:  [id number] DATE OF BIRTH:  [birth year]  DATE OF ADMISSION:  05/22/2012 DATE OF DISCHARGE:  05/23/2012  DISCHARGE DIAGNOSES: 1. Non-ST elevation myocardial infarction.  2. Status post proximal right coronary artery stent.  3. Hypertension.  4. Tobacco abuse.  5. Noncompliance.  6. Hyperlipidemia with hypertriglyceridemia.   CONSULT: Dr. Mariah MillingGollan of cardiology.   PROCEDURES: Cardiac catheterization. Initial cardiac catheterization showed significant stenosis of 80% on his RCA. Repeat cardiac catheterization for the proximal RCA stent.   ADMITTING HISTORY AND PHYSICAL: Please see detailed history and physical dictated on 05/22/2012. In brief, 49 year old male patient with history of hypertension, diabetes, hypertriglyceridemia who recently had a normal stress presented to the Emergency Room complaining of retrosternal chest pain, had troponin elevation of 0.4 which trended up with MB positive and patient was admitted for NSTEMI.   HOSPITAL COURSE: Patient was admitted on the tele floor on a heparin drip. Dr. Mariah MillingGollan of cardiology was constituted. Patient was taken to the cardiac catheterization lab, had cardiac catheterization which showed RCA 80% stenosis. Had stent placed in the proximal RCA. Patient is discharged home on a beta blocker, ACE inhibitor, statin, aspirin and Plavix.   Patient did have significant hypertriglyceridemia of 2800 during the prior admission and was started on gemfibrozil and this is slowly trending down to 1700 in four days. Patient will also be on atorvastatin for his hyperlipidemia.   Patient's blood pressure has been well controlled during the hospital stay.   Patient has been counseled to quit smoking for more than three minutes.   DISCHARGE MEDICATIONS:  1. Aspirin 325 mg oral once a day.  2. Plavix 75 mg oral once a day.  3. Thiamine 100 mg oral once a day.  4. Trazodone 50 mg oral at bedtime.  5. Alprazolam 0.5 mg oral 2  times a day.  6. Insulin 70/30, 40 units subcutaneous twice a day.  7. Gemfibrozil 600 mg oral daily.  8. Hydralazine 50 mg oral 4 times a day.  9. Lisinopril 20 mg oral once a day.  10. Aspirin 325 mg oral once a day.  11. Lopressor 50 mg oral 2 times a day.  12. Nitroglycerin 0.4 mg sublingual as needed for chest pain.  13. Atorvastatin 20 mg oral once a day.   DISCHARGE INSTRUCTIONS: Patient will follow up with Dr. Mariah MillingGollan in a week. He will need repeat fasting lipid profile in four weeks. He will be on a diabetic cardiac diet with Accu-Cheks 4 times a day before meals and at bedtime. Patient is to return to the Emergency Room if he has any worsening of his symptoms or recurrent chest pain. This plan was discussed with the patient who verbalized understanding, okay with the plan.   TIME SPENT: Time spent today on this discharge dictation along with coordinating care, counseling of the patient and discussing the case with Dr. Mariah MillingGollan of cardiology was 35 minutes.  ____________________________ Molinda BailiffSrikar R. Siddarth Hsiung, MD srs:cms D: 05/23/2012 12:02:36 ET T: 05/25/2012 10:49:35 ET JOB#: [id number]  cc: Wardell HeathSrikar R. Elpidio AnisSudini, MD, <Dictator> Antonieta Ibaimothy J. Gollan, MD  Orie FishermanSRIKAR R Yasamin Karel MD ELECTRONICALLY SIGNED 06/03/2012 13:47

## 2015-02-24 NOTE — Op Note (Signed)
PATIENT NAME:  Rodney [name], Kirkland D MR#:  [id number] DATE OF BIRTH:  [birth year]  DATE OF PROCEDURE:  06/28/2013  PREOPERATIVE DIAGNOSIS:  Left mandibular subperiosteal abscess.  POSTOPERATIVE DIAGNOSIS:  Left mandibular subperiosteal abscess.  PROCEDURE:  Aspiration of left mandibular subperiosteal abscess.   SURGEON: Marion DownerScott Rhodesia Stanger, M.D.   ANESTHESIA: Local.   DESCRIPTION OF PROCEDURE: After discussing the procedure with the patient, the gingiva on the left side of the mandible was injected in the gingivobuccal space with 1% lidocaine with epinephrine 1:100,00.  An 18-gauge needle was used to aspirate and a very small amount of loculated purulence was aspirated. Three separate aspirations were performed, and no further purulence was obtained with subsequent aspirations. The patient tolerated the procedure well and said he felt better after the aspiration was completed.     ____________________________ Ollen GrossPaul S. Willeen CassBennett, MD psb:dmm D: 06/28/2013 12:22:00 ET T: 06/28/2013 13:26:40 ET JOB#: [id number]  cc: Ollen GrossPaul S. Willeen CassBennett, MD, <Dictator> Sandi MealyPAUL S Grettel Rames MD ELECTRONICALLY SIGNED 07/07/2013 17:30

## 2015-02-24 NOTE — H&P (Signed)
PATIENT NAME:  Rodney [name], Cross D MR#:  [id number] DATE OF BIRTH:  1965/12/13  DATE OF ADMISSION:  06/27/2013  REFERRING PHYSICIAN:  Dr. Cyril LoosenKinner  PRIMARY CARE PHYSICIAN: The patient reports [facility] for primary for cardiology.   CHIEF COMPLAINT:  1.  Dental complaint.  2.  Incidental finding of elevated troponin.   HISTORY OF PRESENT ILLNESS: This is a 49 year old male with significant past medical history of coronary artery disease status post CABG and a drug-eluting stent in July/August 2013 at [facility] for coronary artery disease, hypertension, hyperlipidemia, diabetes, presents with complaints of dental pain. The patient reports he has been having tooth ache for some time.  At home today, he took four pills of pain medicine he had the left over from his cardiac surgery from last year. He does not recall the exact name of medications. He reports he had some dizziness and lightheadedness following him taking these pills. He came today with complaint of  dental pain. The patient was found to have evidence of leukocytosis at 18,000, but had no fever. He had CT maxillofacial with IV contrast, which did show evidence of left dental abscess. As well, CT maxillofacial with IV contrast did show evidence of bilateral internal carotid artery stenosis. The patient denies any tingling, numbness, vertigo, focal deficits.  The patient had basic work-up done including a troponin which came back surprisingly positive at 0.31. The patient denies any chest pain, any shortness of breath, any palpitation, sweating or diaphoresis. The patient did not have any EKG changes from previous. The patient had 324 mg of aspirin in the ED. The hospitalist service was requested to admit the patient for further evaluation for his elevated troponin.   PAST MEDICAL HISTORY: 1.  Type 2 diabetes.  2.  History of depression.  3.  History of polysubstance abuse.  4.  History of noncompliance with medication.  5.  History of coronary artery disease, status post stent placement in RCA and status [facility].  6.  Obstructive sleep apnea.  7.  Hyperlipidemia.  8.  Triglyceridemia with history of pancreatitis.  9.  History of hypertension.   PAST SURGICAL HISTORY: 1.  History of cardiac stent placement in 2006, second stent placement in July 2013, and history of CABG in August 2013.  2.  Prior history of abdominal surgery.   SOCIAL HISTORY: The patient lives at home. He reports he smokes about 1 pack every week. Denies any alcohol abuse or any illicit drug use.   FAMILY HISTORY: Significant for diabetes in both parents, alcohol abuse in his father.   ALLERGIES: ZINC SULFATE, SULFA DRUGS.   HOME MEDICATIONS: 1.  Aspirin 81 mg oral daily.  2.  Plavix 75 mg oral daily.  3.  Lisinopril 20 mg oral daily.  4.  Trazodone 50 mg oral at bedtime.  5.  Metformin 500 mg oral 2 times a day.  6.  Novolin 70/30 subcutaneous, 50 units subcutaneous 2 times a day before meals.  7.  Lasix 20 mg oral daily.  8.  Metoprolol 50 mg oral 2 times a day.  9.  Gemfibrozil 600 mg oral 2 times a day.  10.  Atorvastatin 20 mg oral at bedtime.  11.  Plavix 75 mg oral daily.  12.  Hydralazine 50 mg oral 4 times a day.   REVIEW OF SYSTEMS: CONSTITUTIONAL: The patient denies any fever or chills, fatigue, weakness, weight gain, weight loss.  EYES: Denies blurry vision,  double vision, glaucoma.  ENT: Denies tinnitus, ear pain, hearing loss, epistaxis or discharge. Complains of dental pain. Denies any dysphagia or odynophagia.  RESPIRATORY: Denies any cough, wheezing, hemoptysis, dyspnea, painful respiratory.  CARDIOVASCULAR: Denies any chest pain, edema, arrhythmia, palpitations, syncope.  GASTROINTESTINAL: Denies nausea, vomiting, diarrhea, abdominal pain, hematemesis, melena or coffee-ground emesis, jaundice.   GENITOURINARY: Denies dysuria, hematuria or renal colic.  ENDOCRINE:  Denies  polyuria, polydipsia, heat or cold intolerance.  HEMATOLOGY: Denies anemia, easy bruising, bleeding diathesis INTEGUMENTARY: Denies any acne, rash or skin lesions.  MUSCULOSKELETAL: Denies any neck pain, shoulder pain, knee pain gout, arthritis.  NEUROLOGIC: Denies any CVA, transient ischemic attack, ataxia, headache, seizures, memory loss, tremors, migraine, had episode of dizziness.  PSYCHIATRIC: Denies anxiety, insomnia, bipolar disorder or schizophrenia, nervousness.   PHYSICAL EXAMINATION: VITAL SIGNS: Temperature 98, pulse 78, respiratory rate 16, blood pressure 114/70, saturating 98% on room air.  GENERAL: Well-nourished male who looks comfortable in no apparent distress.  HEENT: Head atraumatic, normocephalic. Pupils equal, reactive to light. Pink conjunctivae. Anicteric sclerae. Moist oral mucosa. He has dental caries in the left lower jaw and eroding significantly in three molar teeth with some swelling laterally in the mucosa. No cervical or submandibular lymphadenopathy could be appreciated.  NECK: Supple. No thyromegaly. No JVD.  CHEST: Good air entry bilaterally. No wheezing, rales, rhonchi. Has no chest tenderness on palpation. No crepitus in chest wall.  CARDIOVASCULAR: S1, S2 heard. No rubs, murmurs or gallops.  ABDOMEN: Soft, nontender, nondistended. Bowel sounds present.  EXTREMITIES: No edema. No clubbing. No cyanosis. Pedal pulses felt bilaterally.  SKIN: Normal skin turgor. Warm and dry. No rash.  PSYCHIATRIC: Appropriate affect. Awake, alert x 3. Intact judgment and insight.  NEUROLOGIC: Cranial nerves grossly intact. Motor 5/5. No focal deficits. Sensation symmetrical and intact bilaterally. Normal finger-to-nose test normal.  Normal cerebellar exam. Negative Romberg sign   PERTINENT LABORATORY, DIAGNOSTIC AND RADIOLOGIC DATA: Glucose 312, BUN 8, creatinine 0.67, sodium 128, potassium 3.5, chloride 95, CO2 24, ALT 19 AST 21, alkaline phosphatase 127. Troponin 0.31. Drugs  of abuse, negative. White blood cells 18.9, hemoglobin 18.2, hematocrit 51.2, platelets 360. Urinalysis negative for leukocyte esterase and nitrites.   EKG showing normal sinus rhythm at 80 beats per minute with T wave inversion in the lateral leads, which is old when it is compared to a previous EKG imaging.   The patient had CT maxillofacial with IV contrast with impression showing left mandible subperiosteal abscess adjacent dental infection and severe stenosis of proximal left than the right internal carotid arteries.   ASSESSMENT AND PLAN: 1.  Elevated troponins. This was discussed with cardiology on-call,  Dr. Adrian Blackwater,  and this appears to be an incidental finding, unexpected, as the patient did not have any complaints of chest pain or shortness of breath as well as no palpitations, but he already received 324 mg of aspirin given his significant cardiac history, but given the fact the patient has negative EKG changes, chest pain or shortness of breath, we will hold on starting him on heparin drip. We will continue to cycle his cardiac enzymes and follow the trend. We will monitor him on telemetry and cardiology will see him in morning  2.  Dental abscess/infection. This was discussed with ear, nose and throat Dr. Willeen Cass. The patient will be seen by him in the morning. Very likely, he will have aspiration of the abscess done by ear, nose and throat. Meanwhile, he will be continued on IV antibiotics,  clindamycin.  3. Tobacco abuse. He was counseled. He does not seem to be interested to quit smoking or a nicotine patch.  4.  Incidental finding in the CT scan,  which shows a stenosis of the right and left carotid artery. The patient is asymptomatic.  He has no tingling, no numbness, no deficits. He is already on aspirin, Plavix and statin and gemfibrozil. We will check carotid ultrasound and if he has significant stenosis, we will consult vascular surgery.  5.  Type 2 diabetes mellitus.  Uncontrolled hyperglycemia. We will hold his metformin especially with the fact he did receive some IV contrast. As well, we will have him on insulin sliding scale and resume him back on a lower dose Novolin 70/30.  6.  History of coronary artery disease. The patient appears to be on optimal medication as he is on aspirin, Plavix, statin, gemfibrozil, lisinopril, beta blockers and has no complaints of chest pain and shortness of breath.  7.  Hyperlipidemia. The patient is on statin and gemfibrozil.  8.  History of hypertension. Blood pressure acceptable, resume on home meds.  9.  Deep vein thrombosis prophylaxis. Subcutaneous heparin.   CODE STATUS: The patient is full code.   TOTAL TIME SPENT ON ADMISSION AND PATIENT CARE: 60 minutes.    ____________________________ Starleen Arms, MD dse:cc D: 06/27/2013 21:00:01 ET T: 06/27/2013 21:27:13 ET JOB#: [id number]  cc: Starleen Arms, MD, <Dictator> DAWOOD Teena Irani MD ELECTRONICALLY SIGNED 07/01/2013 5:35

## 2015-02-24 NOTE — Consult Note (Signed)
Full consult dictated. I aspirated a minimal amount of purulence from this small subperiostial mandibular abscess. Most of this is cellulitis from his dental infection. He can be discharged on po Clindamycin once his other medical issues are stabilized. I stressed to the patient that he needs to have the tooth extracted as soon as possible to prevent this from recurring once off antibiotics. Medicine should make plans for working with his dentist to have him off anticoagulation prior to the extraction. As this is a dental problem, no ENT follow-up is necessary.  Electronic Signatures: Sandi MealyBennett, Madailein Londo S (MD)  (Signed on 25-Aug-14 12:25)  Authored  Last Updated: 25-Aug-14 12:25 by Sandi MealyBennett, Drew Lips S (MD)

## 2015-02-24 NOTE — Consult Note (Signed)
PATIENT NAME:  Rodney [name], Rodney [name] MR#:  [id number] DATE OF BIRTH:  1966-07-03  DATE OF CONSULTATION:  06/28/2013  REFERRING PHYSICIAN:   CONSULTING PHYSICIAN:  Rodney NancyShaukat A. Khan, MD  INDICATION FOR CONSULTATION: Elevated troponin.  HISTORY OF PRESENT ILLNESS: This is a 49 year old African American male with a past medical history of PCI and stenting, status post CABG July 2013 at [facility], 4 vessel, hypertension, hyperlipidemia and diabetes who came into the hospital for jaw pain and apparently was found to have on CT maxillofacial abscess. Troponin was 0.31. Thus I was asked to evaluate the patient. The patient denies any chest pain or shortness of breath. Had some dizziness, but took several pain medications before he came in and it was thought that was attributed to that. Hospitalist service was concerned about the troponin and asked me to evaluate the patient. The patient denies any chest pain or shortness of breath, and dizziness has subsided now and is completely gone.   PAST MEDICAL HISTORY: As mentioned: 1.  Diabetes.  2.  Depression. 3.  Polysubstance abuse. 4.  Noncompliance with medication. 5.  CABG in July 2013, 4 vessel, at [facility]. 6.  PCI and stenting of the right coronary artery at [facility]. 7.  Sleep apnea.  8.  Hyperlipidemia.  9.  Triglyceridemia.  10.  Hypertension.   SOCIAL HISTORY: Smokes 1 pack per week. Denies EtOH abuse.   FAMILY HISTORY: Positive for coronary artery disease.   MEDICATIONS: 1.  Aspirin 81 mg. 2.  Plavix. 3.  Lisinopril 20 mg. 4.  Metformin 5 mg. 5.  Novolin insulin. 6.  Lasix 20 mg. 7.  Metoprolol 50 mg b.i.[name].  8.  Lipitor 20 mg once a day. 9.  Hydralazine 50 mg q. 4.   PHYSICAL EXAMINATION: GENERAL: He is alert and oriented x 3, in no acute distress.  VITAL SIGNS: Blood pressure 130/78, respirations 16, pulse 65, temperature 98.5 and saturation 96.8.  NECK: No JVD.  LUNGS: Clear.  HEART: Regular rate and rhythm.  Normal S1, S2. No audible murmur.  ABDOMEN: Soft, nontender. Positive bowel sounds.  EXTREMITIES: No pedal edema.   LABORATORY AND DIAGNOSTICS: EKG shows normal sinus rhythm at 80 beats per minute, left atrial enlargement, poor R wave progression, low voltage.   His initial troponin was 0.31. Ethanol level was negligible. CPK was 77 and CPK-MB was 1.4. His BUN and creatinine were normal. Glucose was 312.  The second set of troponin was 0.29 and the third set was 0.23.   ASSESSMENT AND PLAN: Elevated troponin. Trending down. The etiology is unclear. EKG shows no acute changes. The patient denies any chest pain. Had some dizziness which has resolved. Maybe was related to medications. He has history of coronary artery disease. Probably had demand ischemia from all the pain. He may be discharged with follow-up in the office. Thank you very much for the referral. ____________________________ Rodney NancyShaukat A. Khan, MD sak:sb [name]: 06/28/2013 09:08:12 ET T: 06/28/2013 09:19:42 ET JOB#: [id number]  cc: Rodney NancyShaukat A. Khan, MD, <Dictator> Rodney NancySHAUKAT A KHAN MD ELECTRONICALLY SIGNED 08/03/2013 11:43

## 2015-02-24 NOTE — Consult Note (Signed)
Brief Consult Note: Diagnosis: Alcohol intoxication.   Patient was seen by consultant.   Consult note dictated.   Recommend further assessment or treatment.   Orders entered.   Discussed with Attending MD.   Comments: Mr. Rodney [name] has a h/o alcohol dependence. He has not been drinking much since his triple bypass in July 2013. Last night he had 3 shots of vodka and was arrested for DUI. His last DUI was 8 years ago. He denies suicidal or homicidal ideation. He is not psychotic. There are no symptoms of alcohol withdrawal. He tolerates his lunch well. He reports good compliance with medication and [facility].   PLAN: 1. The patient does not meet criteria for IVC. Please discharge as appropriate.  2. No psychotropic medications recommended.   3. The patient is not interested in substance abuse treatment program participation.   4. Her knows about [facility].  Electronic Signatures: Kristine LineaPucilowska, Toniqua Melamed (MD)  (Signed 11-Jan-14 12:33)  Authored: Brief Consult Note   Last Updated: 11-Jan-14 12:33 by Kristine LineaPucilowska, Salvatore Shear (MD)

## 2015-02-24 NOTE — Consult Note (Signed)
PATIENT NAME:  Rodney [name], Rodney [name] MR#:  [id number] DATE OF BIRTH:  1966/11/03  DATE OF CONSULTATION:  06/28/2013  REFERRING PHYSICIAN:  Huey Bienenstockawood Elgergawy, MD CONSULTING PHYSICIAN:  Ollen GrossPaul S. Willeen CassBennett, MD  REASON FOR CONSULTATION: Jaw swelling.   HISTORY OF PRESENT ILLNESS: This is a 49 year old male who presented to the Emergency Room with swelling of the left jaw. He has had pain in that area for about a week now, but it has been worsening. CT scan indicated a small abscess along the periosteum of the mandible, on the left side.   PAST MEDICAL HISTORY: 1.  Coronary artery disease, status post coronary artery bypass and graft with drug eluding stent. 2.  Type 2 diabetes. 3.  Depression. 4.  Polysubstance abuse. 5.  Medication noncompliance.  6.  Obstructive sleep apnea. 7.  Hyperlipidemia/triglyceridemia with history of pancreatitis.  8.  Hypertension.   PAST SURGICAL HISTORY: Prior coronary artery bypass and graft in 2013 with cardiac stent placement in 2006 and 2013.   SOCIAL HISTORY: The patient lives at home and smokes about a pack a week of cigarettes. Denies alcohol abuse.   FAMILY HISTORY: Significant for diabetes in both parents, alcohol abuse in his father.   ALLERGIES: ZINC SULFATE, SULFA DRUGS.   HOME MEDICATIONS: 1.  Aspirin 81 mg p.o. daily.  2.  Plavix 75 mg p.o. daily.  3.  Lisinopril 20 mg p.o. daily.  4.  Trazodone 50 mg p.o. at bedtime. 5.  Metformin 500 mg p.o. b.i.[name].  6.  Novolin 70/30 subcutaneous 50 units subcutaneously b.i.[name]. before meals. 7.  Lasix 20 mg p.o. daily.  8.  Metoprolol 50 mg p.o. b.i.[name].  9.  Gemfibrozil 600 mg p.o. b.i.[name].  10.  Atorvastatin 20 mg p.o. at bedtime.  11.  Hydralazine 50 mg p.o. q.i.[name].   REVIEW OF SYSTEMS: He has not had any fever, chills, weakness, fatigue. He did have some dizziness and light headedness after taking pain medication for dental pain. Denies ear pain, hearing loss, dysphagia, odynophagia, cough, dyspnea, chest pain,  palpitations, nausea, vomiting, diarrhea.   PHYSICAL EXAMINATION:  VITAL SIGNS: Temperature 98.5, pulse 65, respirations 16, blood pressure 130/78.  GENERAL: Well-developed, well-nourished male in no acute distress.  HEENT:  Head and Face: Normocephalic, atraumatic. There are no facial skin lesions. Ears: External ears, ear canals and tympanic membranes are clear bilaterally with no middle ear effusion or infection. Nose: External nose unremarkable. Nasal cavity is clear. No purulence or polyps are seen. Oral cavity and oropharynx: Teeth revealed decayed tooth involving left mandible, 1 of the molars is decayed with surrounding edema of the gingiva which extends down into the gingivobuccal sulcus. You can feel some external tenderness over the jawline on the left. Tongue and floor of mouth are unremarkable with no edema. Posterior pharynx is clear with no erythema or exudate.  NECK: Supple without adenopathy or mass. Just some tenderness over the left midportion of the mandible. There is no thyromegaly. Salivary glands are soft and nontender without masses.  NEUROLOGIC: Cranial nerves II through XII are grossly intact.   DATA REVIEW: He had a white count yesterday 18.9, which has decreased overnight to 12.8.   I reviewed his CT scan, and there is an area of hypodensity along the left external aspect of the mandible indicating a small area of loculated infection, either a small abscess or phlegmon.   ASSESSMENT: The patient is with decayed left mandibular molar with subperiosteal phlegmon versus abscess.   PLAN: He is already on  IV clindamycin. I will come back later today and inject the area and see if we can aspirate any purulence. They have him on both heparin and Plavix so will try just to do an aspiration. Ultimately, he will need to have the tooth pulled or this problem will recur and I do not have privileges for dental procedures. After the aspiration he can be treated further with the  clindamycin p.o. as an outpatient and needs to follow-up immediately with a dentist or oral surgeon to have the tooth pulled. ____________________________ Ollen Gross. Willeen Cass, MD psb:sb [name]: 06/28/2013 07:43:09 ET T: 06/28/2013 08:20:59 ET JOB#: [id number]  cc: Ollen Gross. Willeen Cass, MD, <Dictator> Sandi Mealy MD ELECTRONICALLY SIGNED 07/07/2013 17:30

## 2015-02-24 NOTE — Discharge Summary (Signed)
PATIENT NAME:  Rodney BucyVINCENT, Rodney D MR#:  [id number] DATE OF BIRTH:  October 17, 1966  DATE OF ADMISSION:  06/27/2013 DATE OF DISCHARGE:  06/28/2013  ADMITTING DIAGNOSIS: Dental pain, as well as elevated troponin.   DISCHARGE DIAGNOSES: 1.  Elevated troponin, felt to be due to possible demand ischemia. The patient is known to have native artery obstructive disease, status post bypass 1 year prior. Seen by cardiology. No further intervention needed at this point.  2.  Dental pain due to left mandibular subperiosteal abscess seen by ENT status post drainage, now on antibiotics. He needs outpatient dental evaluation with extraction of his tooth.  3.  Bilateral carotid artery stenosis, asymptomatic, seen by Dr. Wyn Quakerew of vascular surgery. Needs to be treated for acute infection. Recommended outpatient followup.  4.  Coronary artery disease with status post bypass.  5.  History of depression.  6.  Polysubstance abuse.  7.  History of noncompliance with medications.  8.  Obstructive sleep apnea.  9.  Hyperlipidemia.  10.  Triglyceride with history of pancreatitis.  11.  History of hypertension.  12.  Previous history of cardiac stent placement.  13.  Prior history of abdominal surgery.   CONSULTANTS: Dr. Geanie LoganPaul Bennett, Dr. Welton FlakesKhan, Dr. Wyn Quakerew.   PERTINENT LABORATORY DATA AND EVALUATIONS: Admitting EKG showed normal sinus rhythm with lateral T wave changes. CPK was 77. CK-MB was 1.4. His alcohol level was less than 0.003. His troponin was 0.31. His BMP glucose was 312, BUN 8, creatinine 0.67, sodium 128, potassium 3.5, chloride 98, CO2 is 24, calcium 8.8. LFTs were normal. WBC 18.9, hemoglobin 18.2, platelet count was 316. Chest x-ray showed no acute cardiopulmonary processes. His toxic urine drug screen was negative. CT of the maxillofacial area showed findings consistent with left-sided subperiosteal and adjacent soft tissue abscess along the lateral aspect of the mandibular and alveolar ridge. WBC count on August 25th  was 12.8. Bilateral carotid Dopplers showed 75% to 95% stenosis of the left ICA, 50% to 75% stenosis of the right carotid ICA.   HOSPITAL COURSE: Please refer to H and P done by the admitting physician. The patient is a 49 year old white male with multiple medical problems, medical noncompliant, nicotine addiction, who presented with complaint of having pain in his mouth. The patient was having toothache for a prolonged period of time. In the ED, he was evaluated. He was noted to have a WBC count that was elevated. His maxillofacial CT scan showed possible abscess. Also, the patient was noted to have an elevated troponin. It was unclear why this was checked. Because of the elevated troponin, we were asked to admit the patient. He was admitted to the hospital under observation. He was seen in consultation by ENT. They went ahead and drained the abscess. Recommended antibiotics, as well as followup with a dentist to remove his tooth as soon as possible. The patient also, because of the elevated cardiac enzymes, was seen in consultation by cardiology, Dr. Welton FlakesKhan, who felt that this was more of a demand ischemia. It could also be related to leakage of cardiac enzymes related to his native coronary artery disease. The patient had no symptoms; therefore, no further workup was done. When he had maxillofacial CT scan, his neck showed bilateral carotid stenosis; therefore, a surgical consult was obtained. They recommended outpatient followup. The patient is strongly recommended to take his aspirin, stop smoking. At this time, he is doing fine and is stable for discharge but needs outpatient close followup for multiple ongoing issues.  DISCHARGE MEDICATIONS: Gemfibrozil 600, 1 tab p.o. b.i.d., hydralazine 50, 1 tab 4 times a day, insulin 70/30, 50 units b.i.d., Plavix 75 p.o. daily, metformin 500, 1 tab p.o. b.i.d., lisinopril 20, 2 daily, Lasix 20 daily, aspirin 81, 2 tabs daily, atorvastatin 20 mg at bedtime, metoprolol  tartrate 50, 1 tab p.o. b.i.d., trazodone 50 at bedtime, acetaminophen 650 q.4 p.r.n. for pain, clindamycin 300, 1 tab p.o. q.6 x 7 days, amoxicillin 500, 1 tab p.o. t.i.d. x 7 days, acetaminophen/oxycodone 325/5,  1 tab p.o. q.6 p.r.n. for pain.   TIMEFRAME FOR FOLLOWUP:  One to 2 weeks with primary M.D. The patient needs to make a dental appointment. He should call [facility] to get this taken care of. Also, the patient is to follow up with [facility] vascular in 1 to 2 weeks.     NOTE:  35 minutes spent on this discharge.     ____________________________ Lacie Scotts. Allena Katz, MD shp:dmm D: 06/29/2013 11:49:35 ET T: 06/29/2013 12:52:47 ET JOB#: [id number]  cc: Lamari Youngers H. Allena Katz, MD, <Dictator> Charise Carwin MD ELECTRONICALLY SIGNED 06/29/2013 18:25

## 2015-02-24 NOTE — Consult Note (Signed)
CHIEF COMPLAINT and HISTORY:  Subjective/Chief Complaint carotid stenosis   History of Present Illness Patient admitted with mandibular abscess.  [place] found to have significant carotid stenosis bilaterally.  Read as 50-75% right ICA stenosis and 75-95% left ICA stenosis.  No clear focal deficits.  History of coronary disease and other issues   PAST MEDICAL/SURGICAL HISTORY:  Past Medical History:   Diabetes:    Depression:    Pancreatitis:    polysubstance abuse:    MI - Myocardial Infarct:    Sleep Apnea:    CAD:    Hyperlipidemia:    HTN:    Tracheostomy:    Cardiac Stents:   ALLERGIES:  Allergies:  zinc sulfate containing compounds: Rash  Sulfa drugs: Itching  HOME MEDICATIONS:  Home Medications: Medication Instructions Status  acetaminophen-oxyCODONE 325 mg-5 mg oral tablet 1 tab(s) orally every 6 hours, As Needed - for Pain Active  amoxicillin 500 mg oral capsule 1 cap(s) orally 3 times a day Active  clindamycin 300 mg oral capsule 1 cap(s) orally every 6 hours Active  acetaminophen 325 mg oral tablet 2 tab(s) orally every 4 hours, As needed, pain or temp. greater than 100.4 Active  hydrALAZINE 50 mg oral tablet 1 tab(s) orally 4 times a day Active  gemfibrozil 600 mg oral tablet 1 tab(s) orally 2 times a day (before meals) Active  Novolin 70/30 subcutaneous suspension 50 unit(s) subcutaneous 2 times a day Active  Plavix 75 mg oral tablet 1 tab(s) orally once a day Active  metformin 500 mg oral tablet 1 tab(s) orally 2 times a day Active  lisinopril 20 mg oral tablet 1 tab(s) orally once a day Active  furosemide 20 mg oral tablet 1 tab(s) orally once a day Active  aspirin 81 mg oral tablet, chewable 2 tabs (159m) orally once a day. Active  atorvastatin 20 mg oral tablet 1 tab(s) orally once a day (at bedtime) Active  Metoprolol Tartrate 50 mg oral tablet 1 tab(s) orally 2 times a day Active  trazodone 50 mg oral tablet 1 tab(s) orally once a day (at bedtime)  Active   Family and Social History:  Family History Non-Contributory   Social History positive  tobacco, positive ETOH   + Tobacco Current (within 1 year)   Place of Living Home   Review of Systems:  Fever/Chills Yes   Cough Yes   Sputum No   Abdominal Pain No   Diarrhea No   Constipation No   Nausea/Vomiting No   SOB/DOE No   Chest Pain No   Telemetry Reviewed NSR   Dysuria No   Tolerating PT Yes   Tolerating Diet Yes   Medications/Allergies Reviewed Medications/Allergies reviewed   Physical Exam:  GEN well developed, well nourished, no acute distress, appears older than stated age   H85hearing intact to voice, poor dentition   NECK trachea midline  left neck swollen, had abscess drained earlier   RESP normal resp effort  no use of accessory muscles   CARD regular rate  positive carotid bruits   VASCULAR ACCESS none   ABD denies tenderness  normal BS   GU no superpubic tenderness   LYMPH positive neck, positive axillae   EXTR negative cyanosis/clubbing, negative edema   SKIN No ulcers, skin turgor good   NEURO cranial nerves intact, motor/sensory function intact   PSYCH alert, A+O to time, place, person   LABS:  Laboratory Results: Hepatic:    24-Aug-14 16:00, Comprehensive Metabolic Panel  Bilirubin, Total 0.5  Alkaline Phosphatase 127  SGPT (ALT) 19  SGOT (AST) 21  Total Protein, Serum 7.2  Albumin, Serum 3.4  General Ref:    24-Aug-14 16:00, Acetaminophen, Serum  Acetaminophen, Serum < 2  10-30  POTENTIALLY TOXIC:   > 200 mcg/mL   > 50 mcg/mL at 12 hr after   ingestion   > 300 mcg/mL at 4 hr after   ingestion  Routine Chem:    24-Aug-14 16:00, Cardiac Panel  Result Comment   CK - Slight hemolysis, interpret results with   - caution.   Result(s) reported on 27 Jun 2013 at 08:54PM.    24-Aug-14 16:00, Comprehensive Metabolic Panel  Glucose, Serum 312  BUN 8  Creatinine (comp) 0.67  Sodium, Serum 128  Potassium,  Serum 3.5  Chloride, Serum 95  CO2, Serum 24  Calcium (Total), Serum 8.8  Osmolality (calc) 267  eGFR (African American) >60  eGFR (Non-African American) >60  eGFR values <73m/min/1.73 m2 may be an indication of chronic  kidney disease (CKD).  Calculated eGFR is useful in patients with stable renal function.  The eGFR calculation will not be reliable in acutely ill patients  when serum creatinine is changing rapidly. It is not useful in   patients on dialysis. The eGFR calculation may not be applicable  to patients at the low and high extremes of body sizes, pregnant  women, and vegetarians.  Result Comment   TROPONIN - RESULTS VERIFIED BY REPEAT TESTING.   - CALLED TO JEAN JMartinique@ 1700 ON 06/27/13   - CAF   - READ-BACK PROCESS PERFORMED.   Result(s) reported on 27 Jun 2013 at 04:56PM.  Anion Gap 9    24-Aug-14 16:00, Ethanol, Serum  Ethanol, S. < 3  Ethanol % (comp) < 0.003  Result(s) reported on 27 Jun 2013 at 04:48PM.    25-Aug-14 018:84 Basic Metabolic Panel (w/Total Calcium)  Glucose, Serum 300  BUN 11  Creatinine (comp) 0.86  Sodium, Serum 132  Potassium, Serum 3.5  Chloride, Serum 98  CO2, Serum 25  Calcium (Total), Serum 8.6  Anion Gap 9  Osmolality (calc) 275  eGFR (African American) >60  eGFR (Non-African American) >60  eGFR values <679mmin/1.73 m2 may be an indication of chronic  kidney disease (CKD).  Calculated eGFR is useful in patients with stable renal function.  The eGFR calculation will not be reliable in acutely ill patients  when serum creatinine is changing rapidly. It is not useful in   patients on dialysis. The eGFR calculation may not be applicable  to patients at the low and high extremes of body sizes, pregnant  women, and vegetarians.    25-Aug-14 00:17, CBC Profile  Result Comment   HGB/HCT - RESULTS VERIFIED BY REPEAT TESTING.   Result(s) reported on 28 Jun 2013 at 12:37AM.    25-Aug-14 00:17, Troponin I  Result Comment   troponin -  RESULTS VERIFIED BY REPEAT TESTING.   - previously c/ @ 1700 06/27/13 by caf   - mpg 06/28/13   Result(s) reported on 28 Jun 2013 at 01:12AM.    25-Aug-14 07:52, Troponin I  Result Comment   TROPONIN - RESULTS VERIFIED BY REPEAT TESTING.   - RESULTS CALLED @ 1700 ON 06/27/13 BY [facility]   Result(s) reported on 28 Jun 2013 at 08:37AM.  Urine Drugs:    24-Aug-14 18:34, Urine Drug Screen, Qual  Tricyclic Antidepressant, Ur Qual (comp) NEGATIVE  Result(s) reported on 27 Jun 2013 at 06:47PM.  Amphetamines, Urine Qual. NEGATIVE  MDMA, Urine Qual. NEGATIVE  Cocaine Metabolite, Urine Qual. NEGATIVE  Opiate, Urine qual NEGATIVE  Phencyclidine, Urine Qual. NEGATIVE  Cannabinoid, Urine Qual. NEGATIVE  Barbiturates, Urine Qual. NEGATIVE  Benzodiazepine, Urine Qual. NEGATIVE  -----------------  The URINE DRUG SCREEN provides only a preliminary, unconfirmed  analytical test result and should not be used for non-medical   purposes.  Clinical consideration and professional judgment should be   applied to any positive drug screen result due to possible  interfering substances.  A more specific alternate chemical method  must be used in order to obtain a confirmed analytical result.  Gas  chromatography/mass spectrometry (GC/MS) is the preferred  confirmatory method.  Methadone, Urine Qual. NEGATIVE  Cardiac:    24-Aug-14 16:00, Cardiac Panel  CK, Total 77  CPK-MB, Serum 1.4    24-Aug-14 16:00, Troponin I  Troponin I 0.31  0.00-0.05  0.05 ng/mL or less: NEGATIVE   Repeat testing in 3-6 hrs   if clinically indicated.  >0.05 ng/mL: POTENTIAL   MYOCARDIAL INJURY. Repeat   testing in 3-6 hrs if   clinically indicated.  NOTE: An increase or decrease   of 30% or more on serial   testing suggests a   clinically important change    25-Aug-14 00:17, Cardiac Panel  CK, Total 58  CPK-MB, Serum 1.3  Result(s) reported on 28 Jun 2013 at 01:10AM.    25-Aug-14 00:17, Troponin I  Troponin I 0.29   0.00-0.05  0.05 ng/mL or less: NEGATIVE   Repeat testing in 3-6 hrs   if clinically indicated.  >0.05 ng/mL: POTENTIAL   MYOCARDIAL INJURY. Repeat   testing in 3-6 hrs if   clinically indicated.  NOTE: An increase or decrease   of 30% or more on serial   testing suggests a   clinically important change    25-Aug-14 07:52, Cardiac Panel  CK, Total 53  CPK-MB, Serum 0.9  Result(s) reported on 28 Jun 2013 at 08:35AM.    25-Aug-14 07:52, Troponin I  Troponin I 0.23  0.00-0.05  0.05 ng/mL or less: NEGATIVE   Repeat testing in 3-6 hrs   if clinically indicated.  >0.05 ng/mL: POTENTIAL   MYOCARDIAL INJURY. Repeat   testing in 3-6 hrs if   clinically indicated.  NOTE: An increase or decrease   of 30% or more on serial   testing suggests a   clinically important change  Routine UA:    24-Aug-14 18:34, Urinalysis  Color (UA) Yellow  Clarity (UA) Clear  Glucose (UA)   300 mg/dL  Bilirubin (UA) Negative  Ketones (UA) 1+  Specific Gravity (UA) 1.005  Blood (UA) Negative  pH (UA) 5.0  Protein (UA) 25 mg/dL  Nitrite (UA) Negative  Leukocyte Esterase (UA) Negative  Result(s) reported on 27 Jun 2013 at 06:57PM.  RBC (UA) 1 /HPF  WBC (UA) 3 /HPF  Bacteria (UA) TRACE  Epithelial Cells (UA)   NONE SEEN  Mucous (UA) PRESENT  Hyaline Cast (UA) 2 /LPF  Result(s) reported on 27 Jun 2013 at 06:57PM.  Routine Coag:    24-Aug-14 16:00, Activated PTT  Activated PTT (APTT) 29.2  A HCT value >55% may artifactually increase the APTT. In one study,  the increase was an average of 19%.  Reference: "Effect on Routine and Special Coagulation Testing Values  of Citrate Anticoagulant Adjustment in Patients with High HCT Values."  American Journal of Clinical Pathology 2006;126:400-405.  Routine Hem:    24-Aug-14 16:00, Hemogram, Platelet Count  WBC (CBC) 18.9  RBC (CBC) 5.61  Hemoglobin (CBC) 18.2  Hematocrit (CBC) 51.2  Platelet Count (CBC) 316  Result(s) reported on 27 Jun 2013 at  04:43PM.  MCV 91  MCH 32.4  MCHC 35.5  RDW 13.1    25-Aug-14 00:17, CBC Profile  WBC (CBC) 12.8  RBC (CBC) 5.17  Hemoglobin (CBC) 16.8  Hematocrit (CBC) 46.8  Platelet Count (CBC) 246  MCV 91  MCH 32.6  MCHC 36.0  RDW 13.2  Neutrophil % 76.8  Lymphocyte % 15.3  Monocyte % 6.1  Eosinophil % 0.8  Basophil % 1.0  Neutrophil # 9.9  Lymphocyte # 2.0  Monocyte # 0.8  Eosinophil # 0.1  Basophil # 0.1   RADIOLOGY:  Radiology Results: XRay:    24-Aug-14 16:57, Chest Portable Single View  Chest Portable Single View  REASON FOR EXAM:    dizziness  COMMENTS:       PROCEDURE: DXR - DXR PORTABLE CHEST SINGLE VIEW  - Jun 27 2013  4:57PM     RESULT: Comparison: None    Findings:     Single portable AP chest radiograph is provided.  There is no focal   parenchymal opacity, pleural effusion, or pneumothorax. Normal   cardiomediastinal silhouette. Prior median sternotomy. The osseous   structures are unremarkable.    IMPRESSION:   No acute disease of the chest.    Dictation Site: 1        Verified By: Norman Herrlich., MD  [place]:    25-Aug-14 09:49, US Carotid Doppler Bilateral  US Carotid Doppler Bilateral  REASON FOR EXAM:    abnormal findng IN CT maxiofaciall with constrast   showing left and right ICA ste  COMMENTS:       PROCEDURE: [place]  - US CAROTID DOPPLER BILATERAL  - Jun 28 2013  9:49AM     RESULT: Indication: Carotid atherosclerosis    Comparison: None    Technique: Gray-scale, color Doppler, and spectral Doppler images were   obtained of the extracranial carotid artery systems and vertebral   arteries in the neck.    Findings:  On the right, there is moderate after sclerotic plaque within the distal   CCA, carotid bulb and proximal ICA. Maximum peak systolic velocity in the   right CCA is 94 cm/second. Maximum peak systolic velocity in the right   ICA is 237 cm/second. Maximum peak systolic velocity in the right ECA is   135 cm/second. The right  ICA/CCA ratio is 2.5. This corresponds to a   stenosis of 50-75%. Antegrade blood flow is documented in the right   vertebral artery.    On the left, there is moderate-severe complex atherosclerotic plaque   within the carotid bulb and proximal ICA. Maximum peak systolic velocity   in the left CCA is 83 cm/second. Maximum peak systolic velocity in the   left ICA is 600 cm/second. Maximum peak systolic velocity in the left ECA   is 365 cm/second. The left ICA/CCA ratio is 7.2. This corresponds to a   stenosis of 75-95 %. Antegrade blood flow is documented in the left   vertebral artery.  IMPRESSION:    1. There is 75-95% stenosis of the proximal left ICA.  2. There is 50-75% stenosis of the right carotid bulb/proximal ICA.    DictationSite: 1        Verified By: Jennette Banker, M.D., MD  LabUnknown:    11-Jan-14 02:14, CT Maxillofacial Area Without Contrast  PACS Image    24-Aug-14 16:57,  Chest Portable Single View  PACS Image    24-Aug-14 19:01, CT Maxillofacial Area With Contrast  PACS Image    25-Aug-14 09:49, US Carotid Doppler Bilateral  PACS Image  CT:    11-Jan-14 02:14, CT Maxillofacial Area Without Contrast  CT Maxillofacial Area Without Contrast  REASON FOR EXAM:    injury  COMMENTS:       PROCEDURE: CT  - CT MAXILLOFACIAL AREA WO  - Nov 14 2012  2:14AM     RESULT: Axial CT scanning was performed through the facial bones using a   bone algorithm. Reconstructions were obtained in the axial plane at 3 mm   intervals and slice thicknesses. Coronal reconstructions were obtained as   well.    The nasal bones appear intact. There may be an old nasal fracture with   some deformity resulting in the nodes being oriented toward the right.   There is no free fluid in the paranasal sinuses. The zygomatic arches are   intact. The globes appear intact. No orbital fracture or sinus wall   fractures demonstrated. The nasal septum is deviated toward the left. The      temporomandibular joints appear normal. No acute mandibular fracture is   demonstrated.    IMPRESSION:  There is no acute bony abnormality of the facial bones.    A preliminary report was sent to the emergency department at the   conclusion of the study.     Dictation Site: 1        Verified By: DAVID A. Martinique, M.D., MD    24-Aug-14 19:01, CT Maxillofacial Area With Contrast  CT Maxillofacial Area With Contrast  REASON FOR EXAM:    ?oral abscess  COMMENTS:       PROCEDURE: CT  - CT MAXILLOFACIAL AREA W  - Jun 27 2013  7:01PM     RESULT: Axial CT scanning was performed through the facial bones   following administration of 75 cc of Isovue-370. Soft tissue andbone   windows were photographed.    The facial bones appear intact. There is lucency at the level of the   lower molar on the left with a small amount of adjacent increased soft   tissue density which could reflect abscess. This is appreciated best on   images 21 through 26. The presumed abscess measures approximately 2.2 cm   oblique AP by 1 cm oblique transverse. There is a mildly enhancing 2 to 3   mm the rim.  The paranasal sinuses exhibit no air fluid levels. The temporomandibular   joints appear normal.    IMPRESSION:    1. The findings are consistent with a left-sided subperiosteal and   adjacent soft tissue abscess along the lateral aspect of the mandibular   alveolar ridge adjacent to the site of dental defect.  2. I do not see bulky cervical lymph nodes.  3. There are calcifications within the carotid bulbs bilaterally.     A preliminary report was sent to the emergency department at the   conclusion of the study.    Dictation Site: 1    Verified By: DAVID A. Martinique, M.D., MD   ASSESSMENT AND PLAN:  Assessment/Admission Diagnosis asymptomatic carotid stenosis.  Moderate right, left high grade mandibular abscess multiple other issues   Plan discussed treatment plans for carotid disease.    Needs to be treated for acute infection now Will see in office in a few weeks to discuss treatment options for his carotid disease Continue statin  and antiplatelets   level 4   Electronic Signatures: Algernon Huxley (MD)  (Signed 25-Aug-14 16:23)  Authored: Chief Complaint and History, PAST MEDICAL/SURGICAL HISTORY, ALLERGIES, HOME MEDICATIONS, Family and Social History, Review of Systems, Physical Exam, LABS, RADIOLOGY, Assessment and Plan   Last Updated: 25-Aug-14 16:23 by Algernon Huxley (MD)

## 2015-02-24 NOTE — Consult Note (Signed)
PATIENT NAME:  Rodney BucyVINCENT, Worley D MR#:  [id number] DATE OF BIRTH:  [birth year]  DATE OF CONSULTATION:  11/14/2012  REFERRING PHYSICIAN:  Dr. Maricela BoLuna Ragsdale. CONSULTING PHYSICIAN:  Detrich Rakestraw B. Jasmene Goswami, MD.  REASON FOR CONSULTATION:  To evaluate a patient with alcohol problems.   IDENTIFYING DATA:  The patient is a 49 year old male with history of alcoholism.   CHIEF COMPLAINT:  "I don't need detox."   HISTORY OF PRESENT ILLNESS:  The patient has a history of alcoholism.  He reports that he has not been drinking excessively since his triple bypass in July of 2013.  On the day of admission he had several shots of vodka and was arrested for DUI.  His last DUI was 8 years ago.  Police kept him in jail for a while.  He was then brought to the Emergency Room.  The patient denies any symptoms of depression, anxiety or psychosis.  He denies thoughts of hurting himself or others.  He is cool and collected.  There are no symptoms of alcohol withdrawal.  He denies other than alcohol, illicit substance or prescription pill abuse.   PAST PSYCHIATRIC HISTORY:  There was one hospitalization at [facility].  He denies treatment for alcoholism.  Denies blackouts, seizures or delirium tremens.  There is no history of suicide attempt.   FAMILY PSYCHIATRIC HISTORY:  Father with alcoholism.   PAST MEDICAL HISTORY:  Hypertension, coronary artery disease status post CABG, dyslipidemia, obesity.   MEDICATIONS:  At the time of assessment, aspirin 81 mg daily, atorvastatin 20 mg daily, Plavix 75 mg daily, furosemide 20 mg daily, Lopid 600 mg twice daily, Hydralazine 50 mg 4 times daily, lisinopril 20 mg daily, metoprolol 50 mg twice daily, trazodone 50 mg at night, Novolin 70/30 with 50 units subcutaneously twice daily, metformin 500 mg twice daily.   ALLERGIES:  SULFA DRUGS, ZINC-CONTAINING COMPOUNDS.   SOCIAL HISTORY:  He is divorced.  He has not been employed since his bypass.  He applied for  disability, but is still waiting for the response.  He lives by himself.   REVIEW OF SYSTEMS:   CONSTITUTIONAL:  No fevers or chills.  No weight changes.  EYES:  No double or blurred vision.  EARS, NOSE, THROAT:  No hearing loss.  RESPIRATORY:  No shortness of breath or cough.  CARDIOVASCULAR:  No chest pain or orthopnea.  GASTROINTESTINAL:  No abdominal pain, nausea, vomiting or diarrhea.  GENITOURINARY:  No incontinence or frequency.  ENDOCRINE:  No heat or cold intolerance.  LYMPHATIC:  No anemia or easy bruising.  INTEGUMENTARY:  No acne or rash.  MUSCULOSKELETAL:  No muscle or joint pain.  NEUROLOGIC:  No tingling or weakness.  PSYCHIATRIC:  See history of present illness for details.   PHYSICAL EXAMINATION: VITAL SIGNS:  Blood pressure 157/96, pulse 119, respirations 18, temperature 96.8.  GENERAL:  This is a slightly obese male in no acute distress.  The rest of the physical examination is deferred to his primary attending.   LABORATORY DATA:  Chemistries are within normal limits except for blood glucose of 160.  Blood alcohol level 0.173.  LFTs within normal limits.  TSH 0.65.  Urine tox screen negative for substances.  CBC within normal limits except for a white blood count of 15.7.  Urinalysis is not suggestive of urinary tract infection.  Serum acetaminophen less than 2.  Serum salicylates 3.4.   MENTAL STATUS EXAMINATION:   The patient is alert and oriented to person, place, time and  situation.  He is pleasant, polite and cooperative.  He is wearing hospital scrubs.  He maintains good eye contact.  His speech is of normal rhythm, rate and volume.  Mood is fine with full affect.  Thought processing is logical and goal-oriented.  Thought content, he denies suicidal or homicidal ideation.  There are no delusions or paranoia.  There are no auditory or visual hallucinations.  His cognition is grossly intact.  He registers 3 out of 3 and recalls 3 out of 3 objects after five minutes.   He can spell "world" forwards and backwards.  He can name current president.  His insight is pretty good.  His judgment is poor.    SUICIDE RISK ASSESSMENT:  This is a patient with history of alcoholism who was just arrested for DUI.  He denies continuous drinking or symptoms of alcohol withdrawal.  He is not interested in quitting drinking altogether and declines substance abuse treatment program.  ASSESSMENT:   AXIS I:  Alcohol intoxication, alcohol dependence.  AXIS II:  Deferred.  AXIS III:  Hypertension, coronary artery disease, dyslipidemia.  AXIS IV:  Substance abuse, physical problems, financial, employment, primary support, access to care.  AXIS V:  Global assessment of functioning 45.   PLAN:   1.  The patient does not meet criteria for involuntary inpatient psychiatric commitment.  Please discharge as appropriate.  2.  No psychotropic medications recommended at this point.  3.  The patient does not require alcohol detox.  He minimizes his problems and declines substance abuse treatment program participation.  4.  He will follow up with [facility] if necessary.  The patient feels that he does not need a psychiatrist or a substance abuse counselor.     ____________________________ Ellin Goodie Jennet Maduro, MD jbp:ea D: 11/14/2012 13:19:40 ET T: 11/15/2012 01:45:39 ET JOB#: [id number]  cc: Burr Soffer B. Jennet Maduro, MD, <Dictator> Shari Prows MD ELECTRONICALLY SIGNED 11/16/2012 0:07

## 2015-02-25 NOTE — Consult Note (Signed)
PATIENT NAME:  Rodney [name], Rodney [name] MR#:  [id number] DATE OF BIRTH:  [birth year]  DATE OF CONSULTATION:  05/03/2014  REFERRING PHYSICIAN:   CONSULTING PHYSICIAN:  Uzma S. Faheem, MD  HISTORY OF PRESENT ILLNESS: The patient is a 49 year old male with history of hypertension, diabetes, and alcohol who was admitted on 06/29 after he had a seizure in the ED from prolonged drinking. He was initially evaluated by Dr. Guss Bundehalla in the ED and was discharged at that time. However, before being discharged from the Emergency Room, the patient developed a seizure in the morning due to alcohol withdrawal. His blood sugar was also high, around 450. The ED physician, Dr. Silverio LayYao, requested hospitalist to admit the patient. He was confused but has expressive aphasia.   The patient has been in the hospital for the past 2 days. He is alert and oriented but has expressive aphasia and is unable to explain anything in detail. He is not having any withdrawal symptoms at this time. I asked him several questions regarding his placement, but he is unable to tell me. 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 The patient's brother has already declined him while he was in the ER as he is unable to take care of him at this time. The patient is not exhibiting any mood symptoms at this time. He is on CIWA protocol as well as on Neurontin to help with his mood symptoms and seizure disorder.   TREATMENT PLAN: I discussed the case with [facility] as well as reviewed the patient's notes in detail. He is already being followed by the clinical social worker who is working on his placement. The patient has expressive aphasia and he needs placement. He will probably benefit from neurology consult at this time as he does not have any medications and already had a seizure. I will not make any adjustment to his current psychotropic medications.   Thank you for allowing me to participate in the care of this patient.    ____________________________ Ardeen FillersUzma S. Garnetta BuddyFaheem, MD usf:sb [name]: 05/03/2014 15:33:54 ET T: 05/03/2014 16:19:22 ET JOB#: [id number]  cc: Ardeen FillersUzma S. Garnetta BuddyFaheem, MD, <Dictator> Rhunette CroftUZMA S FAHEEM MD ELECTRONICALLY SIGNED 05/08/2014 11:51

## 2015-02-25 NOTE — H&P (Signed)
PATIENT NAME:  Rodney [name], Daishon D MR#:  [id number] DATE OF BIRTH:  [birth year]  DATE OF ADMISSION:  05/02/2014  PRIMARY CARE PHYSICIAN: Nonlocal.   REFERRING PHYSICIAN: Dr. Silverio LayYao.   CHIEF COMPLAINT: Alcohol withdrawal and high blood sugar today.   HISTORY OF PRESENT ILLNESS: A 49 year old male with a history of hypertension, diabetes, alcohol abuse. Was sent to ED 3 days ago for agitation and aggressiveness after drinking alcohol in his brother's house. The patient was diagnosed with alcohol abuse with agitation. The patient was placed on CIWA protocol for the past 3 days; however, the patient developed 1 episode of seizur this morning, possibly due to alcohol withdrawal. The patient's blood sugar was also high, above 450. ED physician, Dr. Silverio LayYao, requested hospitalist to admit the patient. The patient is awake, alert but confused. Has expressive aphasia. The patient denies any symptoms.   PAST MEDICAL HISTORY: CVA with expressive aphasia, bilateral carotid stenosis, hypertension, diabetes, depression, polysubstance abuse, noncompliance with medication, OSA, hyperlipidemia, triglyceridemia with a history of pancreatitis.   SURGICAL HISTORY: Cardiac stent in 2006, second stent placement in July 2013, history of CABG in 2013, prior history for abdominal surgery.   SOCIAL HISTORY: According to previous document, the patient smokes 1 pack every week. At that time, the patient has no alcohol abuse and no illicit drug abuse.   FAMILY HISTORY: Diabetes in both parents, alcohol abuse in his father.   REVIEW OF SYSTEMS: Unable to obtain due to the patient's mental status.   ALLERGIES: SULFA DRUGS, ZINC SULFATE-CONTAINING COMPOUND.   HOME MEDICATIONS: Proventil HFA CFC Free 90 mcg 2 puffs inhaled 4 times a day p.r.n., Lopressor 25 mg 1/2 tablet twice a day, lisinopril 20 mg p.o. daily, Lipitor 80 mg p.o. at bedtime, Lantus 28 units subcutaneous once a day at bedtime, gabapentin 100 mg 3 caps once a day in the  morning and 1 cap once a day at bedtime, famotidine 20 mg p.o. b.i.d., baclofen 10 mg p.o. t.i.d., aspirin 81 mg 2 tablets once a day, acetaminophen 325 mg 2 tablets every 4 hours p.r.n.    PHYSICAL EXAMINATION:  VITAL SIGNS: Temperature 97.8, blood pressure 116/82, pulse 107, O2 saturation 98% on room air.  GENERAL: The patient is awake, alert but confused. Has expressive aphasia but follows commands, in no acute distress.  HEENT: Pupils round, equal and reactive to light and accommodation. Moist oral mucosa. Clear oropharynx. Right-sided facial droop which is not new according to the nurse.  NECK: Supple. No JVD or carotid bruit. No lymphadenopathy.  CARDIOVASCULAR: S1, S2, regular rate and rhythm. No murmurs or gallops.  PULMONARY: Bilateral air entry. No wheezing or rales. No use of accessory muscles to breathe.  ABDOMEN: Soft. No distention or tenderness. No organomegaly. Bowel sounds present.  EXTREMITIES: No edema, clubbing or cyanosis. No calf tenderness. Bilateral pedal pulses present.  SKIN: No rash or jaundice.  NEUROLOGIC: The patient is awake, alert but confused. Follows commands. Right arm power 0 out of 5. Right leg about 2 to 3 out of 5. Left side 5 out of 5. The patient also has facial droop on the right side and expressive aphasia and also slurred speech.   LABORATORY DATA: Glucose 397, BUN 14, creatinine 0.59. Electrolytes are normal. Urine drug screen is negative. Urinalysis is negative. WBC 15.6, hemoglobin 16.7, platelets 226.   IMPRESSIONS:  1. Delirium tremens, alcohol withdrawal.  2. Seizure, possibly due to alcohol withdrawal.  3. Hyperglycemia with uncontrolled diabetes.  4. Leukocytosis, possibly due to reaction.  5. History of coronary artery disease, hypertension and bilateral carotid stenosis.  6. Polysubstance abuse.  7. Tobacco abuse.   PLAN OF TREATMENT:  1. The patient will be admitted to medical floor. Will continue CIWA protocol. Start seizure,  aspiration and fall precautions. Give IV fluid support.   3. For hyperglycemia and uncontrolled diabetes, we will continue sliding scale and Lantus 28 units subcutaneous. Adjust does depending on the patient's blood sugar.  4. Leukocytosis: We will follow CBC and repeat a urinalysis.  5. Continue the patient's hypertension medication.  6. Continue aspirin and statin.  7. Will follow up behavioral medicine physician consult.  8. The patient was diagnosed of bilateral carotid artery stenosis more than 70% last year. The patient should have followup with Dr. Wyn Quaker, but because the patient is noncompliant and alcohol abuse, probably the patient needs vascular consult after alcohol withdrawal improves.   Discussed the patient's condition and plan of treatment with the patient.   TIME SPENT: About 62 minutes.   ____________________________ Shaune Pollack, MD qc:gb D: 05/02/2014 16:09:34 ET T: 05/03/2014 00:47:46 ET JOB#: [id number]  cc: Shaune Pollack, MD, <Dictator> Shaune Pollack MD ELECTRONICALLY SIGNED 05/03/2014 22:49

## 2015-02-25 NOTE — Discharge Summary (Signed)
PATIENT NAME:  Rodney [name], Rodney [name] MR#:  [id number] DATE OF BIRTH:  [birth year]  DATE OF ADMISSION:  05/02/2014 DATE OF DISCHARGE:  05/05/2014  DISCHARGE DIAGNOSES: 1.  Delirium tremens from alcohol withdrawal. 2.  Seizure, likely due to alcohol withdrawal; none while in the hospital. 3.  Hyperglycemia with uncontrolled diabetes, likely due to medication noncompliance.    SECONDARY DIAGNOSES: 1.  History of cerebrovascular accident with expressive aphasia.  2.  Bilateral carotid stenosis. 3.  Hypertension. 4.  Diabetes.  5.  Depression.  6.  Polysubstance abuse.  7.  Noncompliance with medications.  8.  Obstructive sleep apnea.  9.  Hyperlipidemia.  10.  Triglyceridemia. 11.  History of pancreatitis.    CONSULTATIONS: Psychiatry, Dr. Brandy HaleUzma Faheem.   PROCEDURES AND RADIOLOGY: None.   MAJOR LABORATORY PANEL: Urinalysis on admission was negative.   HISTORY AND SHORT HOSPITAL COURSE: The patient is a 49 year old male with the above-mentioned medical problems, who was admitted for alcohol withdrawal with elevated blood sugar. Please see Dr. Nicky Pughhen's dictated history and physical for further details. The patient was difficult to get his blood sugar under control due to dietary noncompliance along with medication noncompliance. Psychiatry consultation was obtained with Dr. Arnold LongFaheen, who recommended continuing CIWA protocol for alcohol withdrawal. The patient was slowly improving and was close to his baseline on the 2nd of July, and was discharged back to his home in stable condition. On the day of discharge, his vital signs were as follows: Temperature 98, heart rate 89 per minute, respirations 19 per minute, blood pressure 115/74 mmHg, saturating 93% on room air.   PERTINENT PHYSICAL EXAMINATION ON THE DATE OF DISCHARGE:  CARDIOVASCULAR: S1, S2 normal. No murmurs, rubs, or gallop.  LUNGS: Clear to auscultation bilaterally. No wheezing, rales, rhonchi, or crepitation.  ABDOMEN: Soft and benign.   NEUROLOGIC: The patient was awake and alert but remained confused, which was his baseline. He did have expressive aphasia with some slurred speech, which also from his previous stroke, as a residual defect.  All other physical examination remained at baseline.   DISCHARGE MEDICATIONS: 1.  Lisinopril 20 mg p.o. daily.  2.  Aspirin 81 mg 2 tablets p.o. daily.  3.  Acetaminophen 650 mg p.o. every four hours as needed.  4.  Metoprolol 25 mg 1/2 tablet p.o. b.i.[name].  5.  Gabapentin 100 mg p.o. at bedtime.  6.  Baclofen 10 mg p.o. 3 times a day.  7.  Lipitor 80 mg p.o. at bedtime.  8.  Famotidine 20 mg p.o. b.i.[name].  9.  Insulin Lantus 28 units subcutaneous at bedtime.  10.  Proventil ProAir HFA 2 puffs inhaled 4 times a day as needed.  11.  Gabapentin 300 mg p.o. daily.    DISCHARGE DIET: Low sodium, 1800 ADA.  DISCHARGE ACTIVITY: As tolerated.   DISCHARGE INSTRUCTIONS AND FOLLOW-UP: The patient was instructed to follow up with his primary care physician in 1 to 2 weeks.    TOTAL TIME DISCHARGING THIS PATIENT: 55 minutes.   ____________________________ Ellamae SiaVipul S. Sherryll BurgerShah, MD vss:cg [name]: 05/06/2014 19:44:36 ET T: 05/07/2014 05:17:47 ET JOB#: [id number]  cc: Ozzie Knobel S. Sherryll BurgerShah, MD, <Dictator> Ardeen FillersUzma S. Garnetta BuddyFaheem, MD Patricia PesaVIPUL S Syrianna Schillaci MD ELECTRONICALLY SIGNED 05/08/2014 18:41

## 2015-02-25 NOTE — Consult Note (Signed)
PATIENT NAME:  Rodney BucyVINCENT, Lot D MR#:  [id number] DATE OF BIRTH:  04-Jul-1966  DATE OF CONSULTATION:  04/30/2014  CONSULTING PHYSICIAN:  Surya K. Guss Bundehalla, MD  SEX: Male.  RACE: White.  SUBJECTIVE: The patient was seen in consultation in [facility] Room .The patient is a 49 year old white male with status post CVA and not married, not employed, and has been living with his family. The patient was [place] [employer] for violent behavior after alcohol intoxication.   ALCOHOL AND DRUGS: The patient reports that occasionally he drinks alcohol but not on a regular basis and he was not aware of his behavior.  OBJECTIVE: The patient was seen in consultation. He is alert but the speech is difficulty to understand and he has aphasia secondary to status post CVA but he can make sense by using his hands and trying to say yes or no when questions are asked. Denies feeling depressed. Denies feeling hopeless, helpless. Denies any ideas or plan to hurt himself further. Denies auditory or visual hallucinations. He knew where he was and he knew that he was drinking alcohol which was bad on himself and contracts for safety and is eager to go home. Denies any ideas or plans to hurt himself further.  IMPRESSION: Alcohol abuse with intoxication and behavioral disturbances secondary to the same. Status post cerebrovascular accident, currently stable with baseline functioning.   RECOMMENDATION: Recommend discontinue IVC and discharge patient home as he contracts for safety.   ____________________________ Jannet MantisSurya K. Guss Bundehalla, MD skc:lt D: 04/30/2014 19:36:11 ET T: 05/01/2014 04:35:31 ET JOB#: [id number]  cc: Monika SalkSurya K. Guss Bundehalla, MD, <Dictator> Beau FannySURYA K CHALLA MD ELECTRONICALLY SIGNED 05/01/2014 17:45

## 2015-02-26 NOTE — Consult Note (Signed)
PATIENT NAME:  Rodney [name], BYINGTON MR#:  [id number] DATE OF BIRTH:  Sep 30, 1966  DATE OF CONSULTATION:  12/16/2011  REFERRING PHYSICIAN:  Suella Broad, MD CONSULTING PHYSICIAN:  Jolanta B. Pucilowska, MD  REASON FOR CONSULTATION: To evaluate the patient with alcohol abuse.   IDENTIFYING DATA: Rodney [name] is a 49 year old male with a history of alcohol dependence.   CHIEF COMPLAINT: "I can go home now."   HISTORY OF PRESENT ILLNESS: Rodney [name] had an abdominal surgery in the past that reportedly went wrong, and he had to be operated on again at [facility]. He suffers abdominal pain since. On the day of admission, he called EMS to bring him to the hospital for abdominal pain. He reports that for the past two days he has been drinking more heavily than usually, consuming a fifth of vodka each day. He reports that his drinking escalated since the end of 2012. He changes his story-sometimes he says that it was in October and sometimes  in December. He lost his job at an [employer]. Since then, he lost his house, was forced to sell his [employer], and his girlfriend left him. He lives in a building as he calls it, paying $250 for rent; but the conditions are substandard. He has money from selling his [place], so he thinks that he is doing all right now. It is quite possible that the patient is in litigation with [facility] and really did not want to be brought to our Emergency Room; however, EMS explained to him that this was his only option. By the time of my assessment, the patient has been medically cleared and sobered up some. His blood alcohol level on admission was over 300. In spite of high blood alcohol level, he is able to hold a sensible conversation. He indicated that his tolerance for alcohol is quite high. He denies any symptoms of depression, anxiety, or psychosis. He denies symptoms suggestive of bipolar mania. He denies other than alcohol substance use. He is not at all interested in getting  any treatment for alcoholism. He knows that he needs to stop but believes that he can handle it himself. In reviewing his chart, I noticed that he has not been coming to our Emergency Room too much. He had one visit on February 1st, was discharged to home, and comes back on February 11th.   PAST PSYCHIATRIC HISTORY: The patient had one hospitalization at [facility] that was very brief. He was discharged on the same day. He was admitted for taking too many aspirins while drunk and when arguing with his girlfriend. He refused treatment for alcoholism or treatment for mood problems then, as he refuses it now. He denies past substance abuse treatments. No blackouts, no delirium tremens. No suicide attempt.  FAMILY PSYCHIATRIC HISTORY: His father was an alcoholic.   PAST MEDICAL/SURGICAL HISTORY:  1. Hypertension.  2. Coronary artery disease, status post stent placement.  3. Dyslipidemia.  4. Obesity, status post abdominal surgery.   MEDICATIONS: At the time of assessment, his list seems incomplete.  1. TriCor, unknown dose.  2. Trazodone 50 mg at night.  3. Thiamine 100 mg daily.  4. Plavix.  5. Norvasc. 6. Lopressor 50 mg twice daily.  7. Folic acid daily.  8. Aspirin. 9. Xanax 0.5 mg twice daily.   ALLERGIES: No known drug allergies.   SOCIAL HISTORY: He is divorced. He used to work for an [employer] good money. They reportedly fired him when he  was [facility] to fix his abdominal problems. They used the fact that he had some illegal software on his hard drive at work and they confiscated the computer while he was gone. He has a teenage child for whom he used to pay child support. He said that he has no money to drink much. He broke up with his girlfriend.    REVIEW OF SYSTEMS: CONSTITUTIONAL: No fevers or chills. No weight changes. EYES: No double or blurred vision. ENT: No hearing loss. RESPIRATORY: No shortness of breath or cough. CARDIOVASCULAR: No  chest pain or orthopnea. GI: Positive for abdominal pain. GU: No incontinence or frequency. ENDOCRINE: No heat or cold intolerance. LYMPHATIC: No anemia or easy bruising. INTEGUMENTARY: No acne or rash. MUSCULOSKELETAL: No joint or muscle pain. NEUROLOGIC: No numbness or weakness. PSYCHIATRIC: See history of present illness for details.   PHYSICAL EXAMINATION:  VITAL SIGNS: Blood pressure 167/113, on repeat 125/69, pulse 116, respirations 20.   GENERAL: This is an obese male in no acute distress. There is normal muscle tone in all extremities. No stiffness or cogwheeling. No tremor yet. The rest of the physical examination is deferred to his primary attending.   MENTAL STATUS EXAMINATION: The patient is alert and oriented to person, place, time, and situation. He is pleasant, polite, and cooperative. He recognizes me from our encounter two years ago. He is marginally groomed, strong body odor. He is wearing hospital scrubs. He maintains good eye contact. His speech is of normal rhythm, rate, and volume. Mood is fine with full affect. Thought processing is logical and goal oriented. Thought content: He denies suicidal or homicidal ideation. There are no delusions or paranoia. There are no auditory or visual hallucinations. His cognition is grossly intact. He registers three out of three and recalls two out of three objects after five minutes. He can spell world forward but not backward. He can name the current President. His insight and judgment are questionable.   SUICIDE RISK ASSESSMENT: This is a patient with a history of alcoholism who adamantly denies any problems with mood, and who is not interested in alcohol detox, treatment of alcoholism or treatment of depression.   ASSESSMENT:  AXIS I:  1. Alcohol dependence.  2. Alcohol intoxication.   AXIS II: Deferred.   AXIS III:  1. Hypertension.  2. Coronary artery disease. 3. Dyslipidemia. 4. Obesity, status post abdominal surgery.   AXIS IV:  Substance abuse, financial, housing, employment, primary support.   AXIS V: Global Assessment of Functioning score is 40.   PLAN: The patient does not meet criteria for involuntary inpatient psychiatric commitment. Please discharge as appropriate. He refuses alcohol detox and intends to continue drinking until such time as he decides to stop.  He is provided information about [facility] outpatient treatment program. He may also walk into [facility] if he decides to get treatment.   ____________________________ Ellin GoodieJolanta B. Jennet MaduroPucilowska, MD jbp:cbb D: 12/16/2011 16:44:34 ET T: 12/17/2011 08:13:26 ET JOB#: [id number]  cc: Jolanta B. Jennet MaduroPucilowska, MD, <Dictator> Shari ProwsJOLANTA B PUCILOWSKA MD ELECTRONICALLY SIGNED 12/20/2011 15:25

## 2015-02-26 NOTE — Consult Note (Signed)
Brief Consult Note: Diagnosis: Alcohol dependence, alcohol intoxication.   Patient was seen by consultant.   Consult note dictated.   Recommend further assessment or treatment.   Comments: Rodney [name] has a h/o alcoholism. He was brought to the hospital by EMS for abdominal pain. He has been drinking more heavily, a fifth of vodka for the past two days. He is able to participate in the interview in spite still high alcohol level indicating high toleratnce. His drinking started escalating in October or December after he lost his job at the [employer]. He is not suicidal or homicidal. He is not interested in substance abuse or mental illness treatment and wishes to be discharged top home.   PLAN: 1. The patient will likely be discharged to home when sober.    1. The patient does not meet criteria for.  Electronic Signatures: Kristine LineaPucilowska, Klaudia Beirne (MD)  (Signed 11-Feb-13 14:32)  Authored: Brief Consult Note   Last Updated: 11-Feb-13 14:32 by Kristine LineaPucilowska, Cevin Rubinstein (MD)

## 2015-06-21 ENCOUNTER — Emergency Department
Admission: EM | Admit: 2015-06-21 | Discharge: 2015-06-21 | Disposition: A | Discharge status: Home / Self Care | Payer: Self-pay | Attending: Student | Admitting: Student

## 2015-06-21 ENCOUNTER — Emergency Department: Payer: Self-pay

## 2015-06-21 DIAGNOSIS — Z7902 Long term (current) use of antithrombotics/antiplatelets: Secondary | ICD-10-CM | POA: Insufficient documentation

## 2015-06-21 DIAGNOSIS — R4789 Other speech disturbances: Secondary | ICD-10-CM | POA: Insufficient documentation

## 2015-06-21 DIAGNOSIS — Z79899 Other long term (current) drug therapy: Secondary | ICD-10-CM | POA: Insufficient documentation

## 2015-06-21 DIAGNOSIS — E785 Hyperlipidemia, unspecified: Secondary | ICD-10-CM | POA: Insufficient documentation

## 2015-06-21 DIAGNOSIS — S63502A Unspecified sprain of left wrist, initial encounter: Secondary | ICD-10-CM | POA: Insufficient documentation

## 2015-06-21 DIAGNOSIS — Y9389 Activity, other specified: Secondary | ICD-10-CM | POA: Insufficient documentation

## 2015-06-21 DIAGNOSIS — Y998 Other external cause status: Secondary | ICD-10-CM | POA: Insufficient documentation

## 2015-06-21 DIAGNOSIS — E119 Type 2 diabetes mellitus without complications: Secondary | ICD-10-CM | POA: Insufficient documentation

## 2015-06-21 DIAGNOSIS — Z7982 Long term (current) use of aspirin: Secondary | ICD-10-CM | POA: Insufficient documentation

## 2015-06-21 DIAGNOSIS — I69898 Other sequelae of other cerebrovascular disease: Secondary | ICD-10-CM | POA: Insufficient documentation

## 2015-06-21 DIAGNOSIS — I10 Essential (primary) hypertension: Secondary | ICD-10-CM | POA: Insufficient documentation

## 2015-06-21 DIAGNOSIS — Y9289 Other specified places as the place of occurrence of the external cause: Secondary | ICD-10-CM | POA: Insufficient documentation

## 2015-06-21 DIAGNOSIS — Z794 Long term (current) use of insulin: Secondary | ICD-10-CM | POA: Insufficient documentation

## 2015-06-21 DIAGNOSIS — X58XXXA Exposure to other specified factors, initial encounter: Secondary | ICD-10-CM | POA: Insufficient documentation

## 2015-06-21 MED ORDER — NAPROXEN 500 MG PO TABS: 500.0000 mg | ORAL_TABLET | Freq: Two times a day (BID) | ORAL | Status: DC

## 2015-06-21 MED ORDER — ALBUTEROL SULFATE (2.5 MG/3ML) 0.083% IN NEBU: 2.5000 mg | INHALATION_SOLUTION | Freq: Once | RESPIRATORY_TRACT | Status: DC

## 2015-06-21 NOTE — ED Notes (Addendum)
Patient to ED with report of left forearm pain, reports pain started yesterday evening, denies any known injury. Patient with significant speech issues due to prior stroke. Patient points to left wrist and states hurts worse with movement.

## 2015-06-21 NOTE — Discharge Instructions (Signed)
Wear splint for 3-5 days as needed. °

## 2015-06-21 NOTE — ED Provider Notes (Signed)
[facility] Emergency Department Provider Note  ____________________________________________  Time seen: Approximately 1:07 PM  I have reviewed the triage vital signs and the nursing notes.   HISTORY  Chief Complaint Arm Pain    HPI Rodney [name]. is a 49 y.o. male patient complaining of left wrist and forearm pain since last night. Patient denies any known injury. History is hard to obtain 6 secondary to the patient's speech issues and secondary to a prior stroke. Patient state increased pain with flexion and extension of the wrist. Patient stated no palliative measures taken for this complaint.   Past Medical History  Diagnosis Date  . Diabetes mellitus     Type II  . History of depression   . Polysubstance abuse   . Coronary artery disease 2013    S/P STENT PLACEMENT/CARDIAC CATHETERIZAITION  . Hyperlipidemia   . History of pancreatitis   . Hypertension     There are no active problems to display for this patient.   Past Surgical History  Procedure Laterality Date  . Cardiac catheterization  2013    S/P STENT PLACEMENT  . Coronary artery bypass graft      Current Outpatient Rx  Name  Route  Sig  Dispense  Refill  . aspirin 81 MG tablet      Takes 2 tablets daily.         Marland Kitchen atorvastatin (LIPITOR) 20 MG tablet   Oral   Take 20 mg by mouth daily.         . clopidogrel (PLAVIX) 75 MG tablet   Oral   Take 75 mg by mouth daily.         . furosemide (LASIX) 20 MG tablet   Oral   Take 20 mg by mouth daily.         Marland Kitchen gemfibrozil (LOPID) 600 MG tablet   Oral   Take 600 mg by mouth 2 (two) times daily.         . hydrALAZINE (APRESOLINE) 50 MG tablet   Oral   Take 50 mg by mouth 4 (four) times daily.         . insulin NPH-insulin regular (NOVOLIN 70/30) (70-30) 100 UNIT/ML injection   Subcutaneous   Inject 40 Units into the skin 2 (two) times daily.         Marland Kitchen lisinopril (PRINIVIL,ZESTRIL) 20 MG tablet   Oral  Take 20 mg by mouth daily.         . metoprolol (LOPRESSOR) 50 MG tablet   Oral   Take 50 mg by mouth 2 (two) times daily.         . traZODone (DESYREL) 50 MG tablet   Oral   Take 50 mg by mouth at bedtime.           Allergies Zinc  No family history on file.  Social History Social History  Substance Use Topics  . Smoking status: Not on file  . Smokeless tobacco: Not on file  . Alcohol Use: Not on file    Review of Systems Constitutional: No fever/chills Eyes: No visual changes. ENT: No sore throat. Cardiovascular: Denies chest pain. Respiratory: Denies shortness of breath. Gastrointestinal: No abdominal pain.  No nausea, no vomiting.  No diarrhea.  No constipation. Genitourinary: Negative for dysuria. Musculoskeletal: Left wrist and forearm pain. Skin: Negative for rash. Neurological: Negative for headaches, but focal weakness or numbness right side secondary to his CVA. Endocrine:Diabetes, lipidemia,  and hypertension Hematological/Lymphatic: Allergic/Immunilogical:Zinc 10-point  ROS otherwise negative.  ____________________________________________   PHYSICAL EXAM:  VITAL SIGNS: ED Triage Vitals  Enc Vitals Group     BP 06/21/15 1253 155/93 mmHg     Pulse Rate 06/21/15 1253 62     Resp 06/21/15 1253 20     Temp 06/21/15 1253 98 F (36.7 C)     Temp Source 06/21/15 1253 Oral     SpO2 06/21/15 1253 95 %     Weight 06/21/15 1253 185 lb (83.915 kg)     Height 06/21/15 1253  (1.575 m)     Head Cir --      Peak Flow --      Pain Score --      Pain Loc --      Pain Edu? --      Excl. in GC? --     Constitutional: Alert and oriented. Well appearing and in no acute distress. Eyes: Conjunctivae are normal. PERRL. EOMI. Head: Atraumatic. Nose: No congestion/rhinnorhea. Mouth/Throat: Mucous membranes are moist.  Oropharynx non-erythematous. Neck: No stridor.  No cervical spine tenderness to palpation. Hematological/Lymphatic/Immunilogical: No  cervical lymphadenopathy. Cardiovascular: Normal rate, regular rhythm. Grossly normal heart sounds.  Good peripheral circulation. Slight elevation of blood pressure. Respiratory: Normal respiratory effort.  No retractions. Lungs CTAB. Gastrointestinal: Soft and nontender. No distention. No abdominal bruits. No CVA tenderness. Musculoskeletal: No obvious deformity of the left wrist. Decreased range of motion with extension. Tender palpation distal radius. Neurologic:  Normal speech and language. No gross focal neurologic deficits are appreciated. No gait instability. Skin:  Skin is warm, dry and intact. No rash noted. Psychiatric: Mood and affect are normal. Speech and behavior are normal.  ____________________________________________   LABS (all labs ordered are listed, but only abnormal results are displayed)  Labs Reviewed - No data to display ____________________________________________  EKG   ____________________________________________  RADIOLOGY  X-rays negative for any acute findings. I, Joni Reining, personally viewed and evaluated these images (plain radiographs) as part of my medical decision making.   ____________________________________________   PROCEDURES  Procedure(s) performed: None  Critical Care performed: No  ____________________________________________   INITIAL IMPRESSION / ASSESSMENT AND PLAN / ED COURSE  Pertinent labs & imaging results that were available during my care of the patient were reviewed by me and considered in my medical decision making (see chart for details).  Sprain left wrist. Skin is negative x-ray report with patient and father. Patient given a Velcro wrist splint and a prescription for naproxen. Patient advised follow-up family doctor. ____________________________________________   FINAL CLINICAL IMPRESSION(S) / ED DIAGNOSES  Final diagnoses:  Sprain of left wrist, initial encounter      Joni Reining, PA-C 06/21/15  1427  Gayla Doss, MD 06/21/15 [phone]

## 2015-09-02 ENCOUNTER — Emergency Department: Payer: Self-pay

## 2015-09-02 ENCOUNTER — Encounter: Payer: Self-pay | Admitting: Emergency Medicine

## 2015-09-02 ENCOUNTER — Inpatient Hospital Stay
Admission: EM | Admit: 2015-09-02 | Discharge: 2015-09-05 | DRG: 896 | Payer: Self-pay | Attending: Internal Medicine | Admitting: Internal Medicine

## 2015-09-02 DIAGNOSIS — I2510 Atherosclerotic heart disease of native coronary artery without angina pectoris: Secondary | ICD-10-CM | POA: Diagnosis present

## 2015-09-02 DIAGNOSIS — Z5309 Procedure and treatment not carried out because of other contraindication: Secondary | ICD-10-CM

## 2015-09-02 DIAGNOSIS — F10929 Alcohol use, unspecified with intoxication, unspecified: Secondary | ICD-10-CM | POA: Diagnosis present

## 2015-09-02 DIAGNOSIS — E785 Hyperlipidemia, unspecified: Secondary | ICD-10-CM | POA: Diagnosis present

## 2015-09-02 DIAGNOSIS — I639 Cerebral infarction, unspecified: Secondary | ICD-10-CM | POA: Diagnosis present

## 2015-09-02 DIAGNOSIS — Z888 Allergy status to other drugs, medicaments and biological substances status: Secondary | ICD-10-CM

## 2015-09-02 DIAGNOSIS — I10 Essential (primary) hypertension: Secondary | ICD-10-CM | POA: Diagnosis present

## 2015-09-02 DIAGNOSIS — Z951 Presence of aortocoronary bypass graft: Secondary | ICD-10-CM

## 2015-09-02 DIAGNOSIS — Z7982 Long term (current) use of aspirin: Secondary | ICD-10-CM

## 2015-09-02 DIAGNOSIS — G4733 Obstructive sleep apnea (adult) (pediatric): Secondary | ICD-10-CM | POA: Diagnosis present

## 2015-09-02 DIAGNOSIS — R4189 Other symptoms and signs involving cognitive functions and awareness: Secondary | ICD-10-CM | POA: Diagnosis present

## 2015-09-02 DIAGNOSIS — Z87891 Personal history of nicotine dependence: Secondary | ICD-10-CM

## 2015-09-02 DIAGNOSIS — G9340 Encephalopathy, unspecified: Secondary | ICD-10-CM | POA: Diagnosis present

## 2015-09-02 DIAGNOSIS — Z955 Presence of coronary angioplasty implant and graft: Secondary | ICD-10-CM

## 2015-09-02 DIAGNOSIS — E119 Type 2 diabetes mellitus without complications: Secondary | ICD-10-CM

## 2015-09-02 DIAGNOSIS — Z79899 Other long term (current) drug therapy: Secondary | ICD-10-CM

## 2015-09-02 DIAGNOSIS — F10129 Alcohol abuse with intoxication, unspecified: Principal | ICD-10-CM | POA: Diagnosis present

## 2015-09-02 DIAGNOSIS — F10921 Alcohol use, unspecified with intoxication delirium: Secondary | ICD-10-CM

## 2015-09-02 DIAGNOSIS — Z8673 Personal history of transient ischemic attack (TIA), and cerebral infarction without residual deficits: Secondary | ICD-10-CM

## 2015-09-02 DIAGNOSIS — K219 Gastro-esophageal reflux disease without esophagitis: Secondary | ICD-10-CM | POA: Diagnosis present

## 2015-09-02 DIAGNOSIS — Z794 Long term (current) use of insulin: Secondary | ICD-10-CM

## 2015-09-02 HISTORY — DX: Obstructive sleep apnea (adult) (pediatric): G47.33

## 2015-09-02 HISTORY — DX: Gastro-esophageal reflux disease without esophagitis: K21.9

## 2015-09-02 LAB — COMPREHENSIVE METABOLIC PANEL
ALBUMIN: 4.8 g/dL (ref 3.5–5.0)
ALK PHOS: 91 U/L (ref 38–126)
ALT: 29 U/L (ref 17–63)
ANION GAP: 12 (ref 5–15)
AST: 24 U/L (ref 15–41)
BUN: 10 mg/dL (ref 6–20)
CALCIUM: 9.1 mg/dL (ref 8.9–10.3)
CHLORIDE: 100 mmol/L — AB (ref 101–111)
CO2: 23 mmol/L (ref 22–32)
CREATININE: 0.75 mg/dL (ref 0.61–1.24)
GFR calc non Af Amer: >60 mL/min (ref >60)
GLUCOSE: 322 mg/dL — AB (ref 65–99)
Potassium: 3.8 mmol/L (ref 3.5–5.1)
SODIUM: 135 mmol/L (ref 135–145)
Total Bilirubin: 0.2 mg/dL — ABNORMAL LOW (ref 0.3–1.2)
Total Protein: 7.8 g/dL (ref 6.5–8.1)

## 2015-09-02 LAB — URINE DRUG SCREEN, QUALITATIVE (ARMC ONLY)
AMPHETAMINES, UR SCREEN: NOT DETECTED
BARBITURATES, UR SCREEN: NOT DETECTED
BENZODIAZEPINE, UR SCRN: NOT DETECTED
Cannabinoid 50 Ng, Ur ~~LOC~~: NOT DETECTED
Cocaine Metabolite,Ur ~~LOC~~: NOT DETECTED
MDMA (Ecstasy)Ur Screen: NOT DETECTED
METHADONE SCREEN, URINE: NOT DETECTED
OPIATE, UR SCREEN: NOT DETECTED
Phencyclidine (PCP) Ur S: NOT DETECTED
TRICYCLIC, UR SCREEN: NOT DETECTED

## 2015-09-02 LAB — URINALYSIS COMPLETE WITH MICROSCOPIC (ARMC ONLY)
BILIRUBIN URINE: NEGATIVE
Bacteria, UA: NONE SEEN
HGB URINE DIPSTICK: NEGATIVE
LEUKOCYTES UA: NEGATIVE
NITRITE: NEGATIVE
PH: 6 (ref 5.0–8.0)
Protein, ur: NEGATIVE mg/dL
RBC / HPF: NONE SEEN RBC/hpf (ref 0–5)
SPECIFIC GRAVITY, URINE: 1.012 (ref 1.005–1.030)
Squamous Epithelial / LPF: NONE SEEN

## 2015-09-02 LAB — CBC
HCT: 49.4 % (ref 40.0–52.0)
HCT: 52.2 % — ABNORMAL HIGH (ref 40.0–52.0)
HEMOGLOBIN: 18 g/dL (ref 13.0–18.0)
HEMOGLOBIN: 17 g/dL (ref 13.0–18.0)
MCH: 31.3 pg (ref 26.0–34.0)
MCH: 31.5 pg (ref 26.0–34.0)
MCHC: 34.4 g/dL (ref 32.0–36.0)
MCHC: 34.5 g/dL (ref 32.0–36.0)
MCV: 90.7 fL (ref 80.0–100.0)
MCV: 91.7 fL (ref 80.0–100.0)
PLATELETS: 200 10*3/uL (ref 150–440)
PLATELETS: 176 10*3/uL (ref 150–440)
RBC: 5.76 MIL/uL (ref 4.40–5.90)
RBC: 5.39 MIL/uL (ref 4.40–5.90)
RDW: 12.4 % (ref 11.5–14.5)
RDW: 12.5 % (ref 11.5–14.5)
WBC: 10.4 10*3/uL (ref 3.8–10.6)
WBC: 10 10*3/uL (ref 3.8–10.6)

## 2015-09-02 LAB — ETHANOL: Alcohol, Ethyl (B): 235 mg/dL — ABNORMAL HIGH (ref <5)

## 2015-09-02 LAB — GLUCOSE, CAPILLARY: GLUCOSE-CAPILLARY: 240 mg/dL — AB (ref 65–99)

## 2015-09-02 LAB — PROTIME-INR
INR: 0.96
PROTHROMBIN TIME: 13 s (ref 11.4–15.0)

## 2015-09-02 LAB — APTT: aPTT: 29 seconds (ref 24–36)

## 2015-09-02 MED ORDER — VITAMIN B-1 100 MG PO TABS
100.0000 mg | ORAL_TABLET | Freq: Every day | ORAL | Status: DC
  Administered 2015-09-03 – 2015-09-05 (×3): 100 mg via ORAL
  Filled 2015-09-02 (×4): qty 1

## 2015-09-02 MED ORDER — ATORVASTATIN CALCIUM 20 MG PO TABS
20.0000 mg | ORAL_TABLET | Freq: Every day | ORAL | Status: DC
  Administered 2015-09-02 – 2015-09-03 (×2): 20 mg via ORAL
  Filled 2015-09-02 (×2): qty 1

## 2015-09-02 MED ORDER — SODIUM CHLORIDE 0.9 % IV BOLUS (SEPSIS)
1000.0000 mL | Freq: Once | INTRAVENOUS | Status: AC
  Administered 2015-09-02: 1000 mL via INTRAVENOUS

## 2015-09-02 MED ORDER — LORAZEPAM 2 MG/ML IJ SOLN
1.0000 mg | Freq: Once | INTRAMUSCULAR | Status: AC
  Administered 2015-09-02: 1 mg via INTRAVENOUS

## 2015-09-02 MED ORDER — INSULIN ASPART 100 UNIT/ML ~~LOC~~ SOLN
0.0000 [IU] | Freq: Four times a day (QID) | SUBCUTANEOUS | Status: DC
  Administered 2015-09-02 – 2015-09-03 (×2): 3 [IU] via SUBCUTANEOUS
  Administered 2015-09-03: 5 [IU] via SUBCUTANEOUS
  Administered 2015-09-03 (×2): 3 [IU] via SUBCUTANEOUS
  Administered 2015-09-04: 2 [IU] via SUBCUTANEOUS
  Administered 2015-09-04: 3 [IU] via SUBCUTANEOUS
  Administered 2015-09-04: 7 [IU] via SUBCUTANEOUS
  Administered 2015-09-04 – 2015-09-05 (×2): 2 [IU] via SUBCUTANEOUS
  Administered 2015-09-05: 3 [IU] via SUBCUTANEOUS
  Administered 2015-09-05: 2 [IU] via SUBCUTANEOUS
  Filled 2015-09-02 (×2): qty 3
  Filled 2015-09-02: qty 2
  Filled 2015-09-02 (×4): qty 3
  Filled 2015-09-02 (×2): qty 2
  Filled 2015-09-02: qty 3
  Filled 2015-09-02: qty 7
  Filled 2015-09-02: qty 5

## 2015-09-02 MED ORDER — LORAZEPAM 1 MG PO TABS: 1.0000 mg | ORAL_TABLET | Freq: Four times a day (QID) | ORAL | Status: DC | PRN

## 2015-09-02 MED ORDER — LORAZEPAM 2 MG/ML IJ SOLN
0.0000 mg | Freq: Four times a day (QID) | INTRAMUSCULAR | Status: AC
Start: 2015-09-02 — End: 2015-09-04
  Administered 2015-09-03: 2 mg via INTRAVENOUS
  Administered 2015-09-03: 1 mg via INTRAVENOUS
  Filled 2015-09-02 (×2): qty 1

## 2015-09-02 MED ORDER — LORAZEPAM 2 MG/ML IJ SOLN
1.0000 mg | Freq: Four times a day (QID) | INTRAMUSCULAR | Status: DC | PRN
  Administered 2015-09-05: 1 mg via INTRAVENOUS
  Filled 2015-09-02 (×2): qty 1

## 2015-09-02 MED ORDER — LORAZEPAM 2 MG/ML IJ SOLN
1.0000 mg | Freq: Once | INTRAMUSCULAR | Status: AC
  Administered 2015-09-02: 1 mg via INTRAVENOUS
  Filled 2015-09-02: qty 1

## 2015-09-02 MED ORDER — LORAZEPAM 2 MG/ML IJ SOLN: 0.0000 mg | Freq: Two times a day (BID) | INTRAMUSCULAR | Status: DC

## 2015-09-02 MED ORDER — LORAZEPAM 2 MG/ML IJ SOLN
1.0000 mg | Freq: Once | INTRAMUSCULAR | Status: DC
  Filled 2015-09-02: qty 1

## 2015-09-02 MED ORDER — FOLIC ACID 1 MG PO TABS
1.0000 mg | ORAL_TABLET | Freq: Every day | ORAL | Status: DC
  Administered 2015-09-03 – 2015-09-05 (×3): 1 mg via ORAL
  Filled 2015-09-02 (×3): qty 1

## 2015-09-02 MED ORDER — THIAMINE HCL 100 MG/ML IJ SOLN: 100.0000 mg | Freq: Every day | INTRAMUSCULAR | Status: DC

## 2015-09-02 MED ORDER — ASPIRIN 81 MG PO CHEW
81.0000 mg | CHEWABLE_TABLET | Freq: Every day | ORAL | Status: DC
  Administered 2015-09-03 – 2015-09-05 (×3): 81 mg via ORAL
  Filled 2015-09-02 (×3): qty 1

## 2015-09-02 MED ORDER — ENOXAPARIN SODIUM 40 MG/0.4ML ~~LOC~~ SOLN
40.0000 mg | Freq: Every day | SUBCUTANEOUS | Status: DC
  Administered 2015-09-02 – 2015-09-04 (×3): 40 mg via SUBCUTANEOUS
  Filled 2015-09-02 (×3): qty 0.4

## 2015-09-02 MED ORDER — STROKE: EARLY STAGES OF RECOVERY BOOK
Freq: Once | Status: AC
  Administered 2015-09-02: 23:00:00

## 2015-09-02 MED ORDER — CLOPIDOGREL BISULFATE 75 MG PO TABS
75.0000 mg | ORAL_TABLET | Freq: Every day | ORAL | Status: DC
  Administered 2015-09-03 – 2015-09-05 (×3): 75 mg via ORAL
  Filled 2015-09-02 (×3): qty 1
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
## 2015-09-02 NOTE — ED Notes (Signed)
Pt very agitated and restless. Speech incoherent.
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
## 2015-09-02 NOTE — ED Notes (Signed)
Pt alert and restless. Unable to follow commands. Pt with noted right side flaccidity. No apparent facial droop. Pt has urinated on self multiple times. Pt not speaking coherently with the exception of one or two expletives.
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
## 2015-09-03 ENCOUNTER — Inpatient Hospital Stay
Admit: 2015-09-03 | Discharge: 2015-09-03 | Disposition: A | Discharge status: Home / Self Care | Payer: Self-pay | Attending: Internal Medicine | Admitting: Internal Medicine

## 2015-09-03 ENCOUNTER — Inpatient Hospital Stay: Payer: Self-pay

## 2015-09-03 DIAGNOSIS — R079 Chest pain, unspecified: Secondary | ICD-10-CM

## 2015-09-03 LAB — LIPID PANEL
CHOL/HDL RATIO: 6.5 ratio
CHOLESTEROL: 150 mg/dL (ref 0–200)
HDL: 23 mg/dL — AB (ref >40)
LDL Cholesterol: UNDETERMINED mg/dL (ref 0–99)
TRIGLYCERIDES: 624 mg/dL — AB (ref <150)
VLDL: UNDETERMINED mg/dL (ref 0–40)

## 2015-09-03 LAB — GLUCOSE, CAPILLARY
GLUCOSE-CAPILLARY: 155 mg/dL — AB (ref 65–99)
GLUCOSE-CAPILLARY: 204 mg/dL — AB (ref 65–99)
Glucose-Capillary: 276 mg/dL — ABNORMAL HIGH (ref 65–99)
Glucose-Capillary: 253 mg/dL — ABNORMAL HIGH (ref 65–99)

## 2015-09-03 LAB — TROPONIN I
Troponin I: <0.03 ng/mL (ref <0.031)
Troponin I: <0.03 ng/mL (ref <0.031)

## 2015-09-03 LAB — HEMOGLOBIN A1C: HEMOGLOBIN A1C: 9.8 % — AB (ref 4.0–6.0)

## 2015-09-03 LAB — CREATININE, SERUM
Creatinine, Ser: 0.6 mg/dL — ABNORMAL LOW (ref 0.61–1.24)
GFR calc Af Amer: >60 mL/min (ref >60)
GFR calc non Af Amer: >60 mL/min (ref >60)

## 2015-09-03 MED ORDER — NITROGLYCERIN 0.4 MG SL SUBL
0.4000 mg | SUBLINGUAL_TABLET | SUBLINGUAL | Status: DC | PRN
  Administered 2015-09-03: 0.4 mg via SUBLINGUAL
  Filled 2015-09-03: qty 1

## 2015-09-03 MED ORDER — ACETAMINOPHEN 325 MG PO TABS: 650.0000 mg | ORAL_TABLET | Freq: Four times a day (QID) | ORAL | Status: DC | PRN

## 2015-09-03 MED ORDER — METOPROLOL TARTRATE 1 MG/ML IV SOLN
5.0000 mg | INTRAVENOUS | Status: DC | PRN
  Administered 2015-09-03: 5 mg via INTRAVENOUS
  Filled 2015-09-03: qty 5

## 2015-09-03 NOTE — Progress Notes (Signed)
Pt. Arrived via stretcher, staff transferred pt. To bed, condom cath applied to collect U/A. General room orientation given. Instructions on how to use ascom and call bell system was given. Pt. Pulled condom cath off. U/A was collected prior to removal.  General hospital pamphlet and stroke education given to pt. Pt. Restless. Swallow screen performed. No swallowing issues noted. VSS. Pt alert and oriented to self. Pt. Placed on tele. Parts of admission assessment were not performed R/T pt. Inability to contribute information at times. Speech is garbled. Right side is flaccid. Skin assessment performed: scabbed scratches noted to right upper lateral thigh, Multiple scars observed to chest/ABD. Prophylactic foam dressing placed to sacrum for protection. Verifying RN, CK.Will continue to monitor pt.

## 2015-09-03 NOTE — Progress Notes (Signed)
Order for hydralazine D/C'd and new or for lopressor ordered per. Dr. Joneen Roachrosley. Will continue to monitor pt.

## 2015-09-03 NOTE — Progress Notes (Signed)
Patient complained of chest pain. Notified Dr. Juliene PinaMody. Per MD administer Nitro SL tablet, order STAT EKG and Troponin. Will continue to monitor patient .

## 2015-09-03 NOTE — Consult Note (Signed)
Reason for Consult: stroke Referring Physician: Dr. Lucile Shutters Rodney [name]. is an 49 y.o. male.  HPI: 49 yo RHD M presents to [facility] after being found down.  Pt does not remember episode but does recall having some EtOH.  He did not bite his tongue or urinate on himself.  Today he feels back to baseline and does not report any new L sided weakness.  Past Medical History  Diagnosis Date  . Diabetes mellitus     Type II  . History of depression   . Polysubstance abuse   . Coronary artery disease 2013    S/P STENT PLACEMENT/CARDIAC CATHETERIZAITION  . Hyperlipidemia   . History of pancreatitis   . Hypertension   . GERD (gastroesophageal reflux disease)   . OSA (obstructive sleep apnea)     Past Surgical History  Procedure Laterality Date  . Cardiac catheterization  2013    S/P STENT PLACEMENT  . Coronary artery bypass graft      History reviewed. No pertinent family history.  Social History:  reports that he has quit smoking. He does not have any smokeless tobacco history on file. He reports that he drinks alcohol. He reports that he does not use illicit drugs.  Allergies:  Allergies  Allergen Reactions  . Zinc     Medications: personally reviewed by me as per chart  Results for orders placed or performed during the hospital encounter of 09/02/15 (from the past 48 hour(s))  Urine Drug Screen, Qualitative ([place] only)     Status: None   Collection Time: 09/02/15  7:54 PM  Result Value Ref Range   Tricyclic, Ur Screen NONE DETECTED NONE DETECTED   Amphetamines, Ur Screen NONE DETECTED NONE DETECTED   MDMA (Ecstasy)Ur Screen NONE DETECTED NONE DETECTED   Cocaine Metabolite,Ur [place] NONE DETECTED NONE DETECTED   Opiate, Ur Screen NONE DETECTED NONE DETECTED   Phencyclidine (PCP) Ur S NONE DETECTED NONE DETECTED   Cannabinoid 50 Ng, Ur [place] NONE DETECTED NONE DETECTED   Barbiturates, Ur Screen NONE DETECTED NONE DETECTED   Benzodiazepine, Ur Scrn NONE DETECTED NONE DETECTED   Methadone Scn, Ur NONE DETECTED NONE DETECTED    Comment: (NOTE) 588  Tricyclics, urine               Cutoff 1000 ng/mL 200  Amphetamines, urine             Cutoff 1000 ng/mL 300  MDMA (Ecstasy), urine           Cutoff 500 ng/mL 400  Cocaine Metabolite, urine       Cutoff 300 ng/mL 500  Opiate, urine                   Cutoff 300 ng/mL 600  Phencyclidine (PCP), urine      Cutoff 25 ng/mL 700  Cannabinoid, urine              Cutoff 50 ng/mL 800  Barbiturates, urine             Cutoff 200 ng/mL 900  Benzodiazepine, urine           Cutoff 200 ng/mL 1000 Methadone, urine                Cutoff 300 ng/mL 1100 1200 The urine drug screen provides only a preliminary, unconfirmed 1300 analytical test result and should not be used for non-medical 1400 purposes. Clinical consideration and professional judgment should 1500 be applied to any positive  drug screen result due to possible 1600 interfering substances. A more specific alternate chemical method 1700 must be used in order to obtain a confirmed analytical result.  1800 Gas chromato graphy / mass spectrometry (GC/MS) is the preferred 1900 confirmatory method.   Urinalysis complete, with microscopic ([facility] only)     Status: Abnormal   Collection Time: 09/02/15  7:54 PM  Result Value Ref Range   Color, Urine STRAW (A) YELLOW   APPearance CLEAR (A) CLEAR   Glucose, UA >500 (A) NEGATIVE mg/dL   Bilirubin Urine NEGATIVE NEGATIVE   Ketones, ur TRACE (A) NEGATIVE mg/dL   Specific Gravity, Urine 1.012 1.005 - 1.030   Hgb urine dipstick NEGATIVE NEGATIVE   pH 6.0 5.0 - 8.0   Protein, ur NEGATIVE NEGATIVE mg/dL   Nitrite NEGATIVE NEGATIVE   Leukocytes, UA NEGATIVE NEGATIVE   RBC / HPF NONE SEEN 0 - 5 RBC/hpf   WBC, UA 0-5 0 - 5 WBC/hpf   Bacteria, UA NONE SEEN NONE SEEN   Squamous Epithelial / LPF NONE SEEN NONE SEEN   Mucous PRESENT   CBC     Status: Abnormal   Collection Time: 09/02/15  7:59 PM  Result Value Ref Range   WBC 10.4 3.8 -  10.6 K/uL   RBC 5.76 4.40 - 5.90 MIL/uL   Hemoglobin 18.0 13.0 - 18.0 g/dL   HCT 52.2 (H) 40.0 - 52.0 %   MCV 90.7 80.0 - 100.0 fL   MCH 31.3 26.0 - 34.0 pg   MCHC 34.5 32.0 - 36.0 g/dL   RDW 12.5 11.5 - 14.5 %   Platelets 200 150 - 440 K/uL  Comprehensive metabolic panel     Status: Abnormal   Collection Time: 09/02/15  7:59 PM  Result Value Ref Range   Sodium 135 135 - 145 mmol/L   Potassium 3.8 3.5 - 5.1 mmol/L   Chloride 100 (L) 101 - 111 mmol/L   CO2 23 22 - 32 mmol/L   Glucose, Bld 322 (H) 65 - 99 mg/dL   BUN 10 6 - 20 mg/dL   Creatinine, Ser 0.75 0.61 - 1.24 mg/dL   Calcium 9.1 8.9 - 10.3 mg/dL   Total Protein 7.8 6.5 - 8.1 g/dL   Albumin 4.8 3.5 - 5.0 g/dL   AST 24 15 - 41 U/L   ALT 29 17 - 63 U/L   Alkaline Phosphatase 91 38 - 126 U/L   Total Bilirubin 0.2 (L) 0.3 - 1.2 mg/dL   GFR calc non Af Amer >60 >60 mL/min   GFR calc Af Amer >60 >60 mL/min    Comment: (NOTE) The eGFR has been calculated using the CKD EPI equation. This calculation has not been validated in all clinical situations. eGFR's persistently <60 mL/min signify possible Chronic Kidney Disease.    Anion gap 12 5 - 15  APTT     Status: None   Collection Time: 09/02/15  7:59 PM  Result Value Ref Range   aPTT 29 24 - 36 seconds  Protime-INR     Status: None   Collection Time: 09/02/15  7:59 PM  Result Value Ref Range   Prothrombin Time 13.0 11.4 - 15.0 seconds   INR 0.96   Ethanol     Status: Abnormal   Collection Time: 09/02/15  7:59 PM  Result Value Ref Range   Alcohol, Ethyl (B) 235 (H) <5 mg/dL    Comment:        LOWEST DETECTABLE LIMIT FOR SERUM ALCOHOL IS  5 mg/dL FOR MEDICAL PURPOSES ONLY   Glucose, capillary     Status: Abnormal   Collection Time: 09/02/15 11:15 PM  Result Value Ref Range   Glucose-Capillary 240 (H) 65 - 99 mg/dL  Lipid panel     Status: Abnormal   Collection Time: 09/02/15 11:43 PM  Result Value Ref Range   Cholesterol 150 0 - 200 mg/dL   Triglycerides 624 (H)  <150 mg/dL   HDL 23 (L) >40 mg/dL   Total CHOL/HDL Ratio 6.5 RATIO   VLDL UNABLE TO CALCULATE IF TRIGLYCERIDE OVER 400 mg/dL 0 - 40 mg/dL   LDL Cholesterol UNABLE TO CALCULATE IF TRIGLYCERIDE OVER 400 mg/dL 0 - 99 mg/dL    Comment:        Total Cholesterol/HDL:CHD Risk Coronary Heart Disease Risk Table                     Men   Women  1/2 Average Risk   3.4   3.3  Average Risk       5.0   4.4  2 X Average Risk   9.6   7.1  3 X Average Risk  23.4   11.0        Use the calculated Patient Ratio above and the CHD Risk Table to determine the patient's CHD Risk.        ATP III CLASSIFICATION (LDL):  <100     mg/dL   Optimal  100-129  mg/dL   Near or Above                    Optimal  130-159  mg/dL   Borderline  160-189  mg/dL   High  >190     mg/dL   Very High   CBC     Status: None   Collection Time: 09/02/15 11:43 PM  Result Value Ref Range   WBC 10.0 3.8 - 10.6 K/uL   RBC 5.39 4.40 - 5.90 MIL/uL   Hemoglobin 17.0 13.0 - 18.0 g/dL   HCT 49.4 40.0 - 52.0 %   MCV 91.7 80.0 - 100.0 fL   MCH 31.5 26.0 - 34.0 pg   MCHC 34.4 32.0 - 36.0 g/dL   RDW 12.4 11.5 - 14.5 %   Platelets 176 150 - 440 K/uL  Creatinine, serum     Status: Abnormal   Collection Time: 09/02/15 11:43 PM  Result Value Ref Range   Creatinine, Ser 0.60 (L) 0.61 - 1.24 mg/dL   GFR calc non Af Amer >60 >60 mL/min   GFR calc Af Amer >60 >60 mL/min    Comment: (NOTE) The eGFR has been calculated using the CKD EPI equation. This calculation has not been validated in all clinical situations. eGFR's persistently <60 mL/min signify possible Chronic Kidney Disease.   Troponin I     Status: None   Collection Time: 09/02/15 11:43 PM  Result Value Ref Range   Troponin I <0.03 <0.031 ng/mL    Comment:        NO INDICATION OF MYOCARDIAL INJURY.   Glucose, capillary     Status: Abnormal   Collection Time: 09/03/15  6:16 AM  Result Value Ref Range   Glucose-Capillary 155 (H) 65 - 99 mg/dL  Troponin I     Status:  None   Collection Time: 09/03/15 10:56 AM  Result Value Ref Range   Troponin I <0.03 <0.031 ng/mL    Comment:        NO  INDICATION OF MYOCARDIAL INJURY.   Glucose, capillary     Status: Abnormal   Collection Time: 09/03/15 11:38 AM  Result Value Ref Range   Glucose-Capillary 276 (H) 65 - 99 mg/dL   Comment 1 Notify RN     Ct Head Wo Contrast  09/02/2015  CLINICAL DATA:  Altered mental status.  Slurred speech. EXAM: CT HEAD WITHOUT CONTRAST TECHNIQUE: Contiguous axial images were obtained from the base of the skull through the vertex without intravenous contrast. COMPARISON:  December 06, 2011 FINDINGS: There is mild diffuse atrophy. There is moderate enlargement of the left lateral ventricle due to ex vacuo phenomenon. There is evidence of prior infarct involving the posterior left frontal lobe, most of the left temporal lobe, as well as much of the anterior to mid left occipital lobe. There is also infarct involving much of the left parietal lobe. There is prior infarct involving portions of the left basal ganglia and thalamus. There are areas of calcification within this infarct as well as areas of increased attenuation in gyral regions, likely residua of prior hemorrhage. There is no obvious acute hemorrhage. There is no mass or extra-axial fluid. There is slight shift of midline structures toward the left, likely due to ex vacuo phenomenon from the prior large infarct on the left. There is a focal area of decreased attenuation in the mid right centrum semiovale which may represent a small acute infarct. There is patchy small vessel disease in the centra semiovale as well. The bony calvarium appears intact. The mastoid air cells are clear. There is leftward deviation of the nasal septum. IMPRESSION: Extensive left-sided infarct with areas of apparent prior hemorrhage. No obvious acute hemorrhage is seen. There is a questionable small acute infarct in the right centrum semiovale. No mass or  extra-axial fluid. Mild leftward deviation of the midline structures is felt to be due to encephalomalacia from a large infarct on the left. Critical Value/emergent results were called by telephone at the time of interpretation on 09/02/2015 at 8:50 pm to Dr. Lisa Roca , who verbally acknowledged these results. Electronically Signed   By: Lowella Grip III M.Rodney.   On: 09/02/2015 20:50   Dg Pelvis Portable  09/02/2015  CLINICAL DATA:  No history available.  Unresponsive? EXAM: PORTABLE PELVIS 1-2 VIEWS COMPARISON:  None. FINDINGS: Single view of the pelvis is provided. Osseous alignment appears normal. No fracture line or displaced fracture fragment identified, although portions of the left femoral neck and sacrum are difficult to evaluate due to slightly oblique patient positioning. No acute- appearing cortical irregularity or osseous lesion. No significant degenerative change. Soft tissues about the pelvis are unremarkable. IMPRESSION: Unremarkable plain film examination of the pelvis, with mild study limitations detailed above. If any possibility of a left femoral neck fracture, would recommend repeat plain film examination with less oblique patient positioning. Electronically Signed   By: Franki Cabot M.Rodney.   On: 09/02/2015 21:53   Dg Chest Portable 1 View  09/02/2015  CLINICAL DATA:  Altered mental status.  Initial encounter. EXAM: PORTABLE CHEST 1 VIEW COMPARISON:  Chest radiograph performed 06/27/2013 FINDINGS: The lungs are well-aerated and clear. There is no evidence of focal opacification, pleural effusion or pneumothorax. The cardiomediastinal silhouette is normal in size. The patient is status post median sternotomy. No acute osseous abnormalities are seen. IMPRESSION: No acute cardiopulmonary process seen. Electronically Signed   By: Garald Balding M.Rodney.   On: 09/02/2015 21:51    Review of Systems  Unable to perform  ROS: language   Blood pressure 138/92, pulse 102, temperature 98.4 F  (36.9 C), temperature source Oral, resp. rate 18, height 5' 2"  (1.575 m), weight 80.287 kg (177 lb), SpO2 90 %. Physical Exam  Constitutional: He appears well-developed and well-nourished. No distress.  HENT:  Head: Normocephalic and atraumatic.  Right Ear: External ear normal.  Left Ear: External ear normal.  Nose: Nose normal.  Mouth/Throat: Oropharynx is clear and moist.  Eyes: Conjunctivae and EOM are normal. Pupils are equal, round, and reactive to light.  Neck: Normal range of motion. Neck supple.  Cardiovascular: Normal rate, regular rhythm, normal heart sounds and intact distal pulses.   Respiratory: Effort normal and breath sounds normal.  GI: Soft. Bowel sounds are normal.  Musculoskeletal: Normal range of motion. He exhibits no edema.  Neurological:  Alert but globally aphasic expressive more than receptive as he follows and gets some words out PERRLA, EOMI, L homonymous, R droop 0/5 R UE, 3/5 R LE, 5/5 L FTN WNL on L R babinski, L downgoing Neglect of R, decreased pin and temp on R  Skin: Skin is warm. He is not diaphoretic.   CT of head personally reviewed by me and shows chronic large L MCA and small subacute R lacunar infarct  Assessment/Plan: 1.  Encephalopathy-  Resolved, this was likely secondary to EtOH but cant completely r/o seizure;  Would not treat seizure in setting of EtOH anyway 2.  Probable R subacute lacunar infarct-  Asymptomatic, likely due to uncontrolled HTN and DM and HLD 3.  Chronic L MCA infarct-  Stable deficits, appears embolic -  Continue ASA 17IO and plavix -  Increase Zocor to 66m and add fish oil -  Needs good BP control which can start now with goal < 130/80 -  Needs good DM control with goal Hem A1c < 7 -  No additional benefit from MRI at this point -  Needs hypercoaguable w/u and TEE if not done in past with last stroke -  Pt encouraged to stop drinking -  Will sign off, please call with additional questions -  Needs to f/u with [facility] in 3 months  Rodney [name] 09/03/2015, 1:52 PM

## 2015-09-03 NOTE — Progress Notes (Signed)
PT. B/P 164/108, Dr. Joneen Roachrosley notified, new order for hydralazine, will continue to monitor pt.

## 2015-09-03 NOTE — Progress Notes (Signed)
[facility] - [place] at [facility]   PATIENT NAME: Rodney [name]    MR#:  [id number]  DATE OF BIRTH:  1966/08/23  SUBJECTIVE:  Patient reported chest pain, resolved after NTG.  Patient mainly answers no and yes  REVIEW OF SYSTEMS:    Review of Systems  Constitutional: Negative for fever, chills and malaise/fatigue.  HENT: Negative for sore throat.   Eyes: Negative for blurred vision.  Respiratory: Negative for cough, hemoptysis, shortness of breath and wheezing.   Cardiovascular: Negative for chest pain, palpitations and leg swelling.  Gastrointestinal: Negative for nausea, vomiting, abdominal pain, diarrhea and blood in stool.  Genitourinary: Negative for dysuria.  Musculoskeletal: Negative for back pain.  Neurological: Positive for speech change and focal weakness. Negative for dizziness, tremors and headaches.  Endo/Heme/Allergies: Does not bruise/bleed easily.  Psychiatric/Behavioral: Positive for substance abuse.    Tolerating Diet: yes     DRUG ALLERGIES:   Allergies  Allergen Reactions  . Zinc     VITALS:  Blood pressure 140/95, pulse 101, temperature 98.1 F (36.7 C), temperature source Oral, resp. rate 18, height  (1.575 m), weight 80.287 kg (177 lb), SpO2 95 %.  PHYSICAL EXAMINATION:   Physical Exam  Constitutional: He is well-developed, well-nourished, and in no distress. No distress.  HENT:  Head: Normocephalic.  Eyes: No scleral icterus.  Neck: Normal range of motion. Neck supple. No JVD present. No tracheal deviation present.  Cardiovascular: Normal rate, regular rhythm and normal heart sounds.  Exam reveals no gallop and no friction rub.   No murmur heard. Pulmonary/Chest: Effort normal and breath sounds normal. No respiratory distress. He has no wheezes. He has no rales. He exhibits no tenderness.  Abdominal: Soft. Bowel sounds are normal. He exhibits no distension and no mass. There is no tenderness. There is no  rebound and no guarding.  Musculoskeletal: Normal range of motion. He exhibits no edema.  Neurological: He is alert.  Speaks yes or no and some conversation but slow Right sided weakness  Skin: Skin is warm. No rash noted. No erythema.  Psychiatric: Affect normal.      LABORATORY PANEL:   CBC  Recent Labs Lab 09/02/15 2343  WBC 10.0  HGB 17.0  HCT 49.4  PLT 176   ------------------------------------------------------------------------------------------------------------------  Chemistries   Recent Labs Lab 09/02/15 1959 09/02/15 2343  NA 135  --   K 3.8  --   CL 100*  --   CO2 23  --   GLUCOSE 322*  --   BUN 10  --   CREATININE 0.75 0.60*  CALCIUM 9.1  --   AST 24  --   ALT 29  --   ALKPHOS 91  --   BILITOT 0.2*  --    ------------------------------------------------------------------------------------------------------------------  Cardiac Enzymes  Recent Labs Lab 09/02/15 2343 09/03/15 1056  TROPONINI <0.03 <0.03   ------------------------------------------------------------------------------------------------------------------  RADIOLOGY:  Ct Head Wo Contrast  09/02/2015  CLINICAL DATA:  Altered mental status.  Slurred speech. EXAM: CT HEAD WITHOUT CONTRAST TECHNIQUE: Contiguous axial images were obtained from the base of the skull through the vertex without intravenous contrast. COMPARISON:  December 06, 2011 FINDINGS: There is mild diffuse atrophy. There is moderate enlargement of the left lateral ventricle due to ex vacuo phenomenon. There is evidence of prior infarct involving the posterior left frontal lobe, most of the left temporal lobe, as well as much of the anterior to mid left occipital lobe. There is also infarct involving much of the  left parietal lobe. There is prior infarct involving portions of the left basal ganglia and thalamus. There are areas of calcification within this infarct as well as areas of increased attenuation in gyral  regions, likely residua of prior hemorrhage. There is no obvious acute hemorrhage. There is no mass or extra-axial fluid. There is slight shift of midline structures toward the left, likely due to ex vacuo phenomenon from the prior large infarct on the left. There is a focal area of decreased attenuation in the mid right centrum semiovale which may represent a small acute infarct. There is patchy small vessel disease in the centra semiovale as well. The bony calvarium appears intact. The mastoid air cells are clear. There is leftward deviation of the nasal septum. IMPRESSION: Extensive left-sided infarct with areas of apparent prior hemorrhage. No obvious acute hemorrhage is seen. There is a questionable small acute infarct in the right centrum semiovale. No mass or extra-axial fluid. Mild leftward deviation of the midline structures is felt to be due to encephalomalacia from a large infarct on the left. Critical Value/emergent results were called by telephone at the time of interpretation on 09/02/2015 at 8:50 pm to Dr. Governor RooksEBECCA LORD , who verbally acknowledged these results. Electronically Signed   By: Bretta BangWilliam  Woodruff III M.D.   On: 09/02/2015 20:50   Dg Pelvis Portable  09/02/2015  CLINICAL DATA:  No history available.  Unresponsive? EXAM: PORTABLE PELVIS 1-2 VIEWS COMPARISON:  None. FINDINGS: Single view of the pelvis is provided. Osseous alignment appears normal. No fracture line or displaced fracture fragment identified, although portions of the left femoral neck and sacrum are difficult to evaluate due to slightly oblique patient positioning. No acute- appearing cortical irregularity or osseous lesion. No significant degenerative change. Soft tissues about the pelvis are unremarkable. IMPRESSION: Unremarkable plain film examination of the pelvis, with mild study limitations detailed above. If any possibility of a left femoral neck fracture, would recommend repeat plain film examination with less oblique  patient positioning. Electronically Signed   By: Bary RichardStan  Maynard M.D.   On: 09/02/2015 21:53   Dg Chest Portable 1 View  09/02/2015  CLINICAL DATA:  Altered mental status.  Initial encounter. EXAM: PORTABLE CHEST 1 VIEW COMPARISON:  Chest radiograph performed 06/27/2013 FINDINGS: The lungs are well-aerated and clear. There is no evidence of focal opacification, pleural effusion or pneumothorax. The cardiomediastinal silhouette is normal in size. The patient is status post median sternotomy. No acute osseous abnormalities are seen. IMPRESSION: No acute cardiopulmonary process seen. Electronically Signed   By: Roanna RaiderJeffery  Chang M.D.   On: 09/02/2015 21:51     ASSESSMENT AND PLAN:   49 year old male with past medical history significant for stroke, diabetes and CAD who was found in the street by a scooter and was unresponsive.   1. Acute encephalopathy: This may be due to acute EtOH intoxication or new stroke as suggested by CT scan. Patient appears to be probably at his baseline. Follow-up a neurology consultation and stroke workup.  2. EtOH intoxication: Patient is on CIWA protocol.  3. CVA: CT scan shows old infarct with a new area possible stroke. Neurology consultation is pending. Follow-up on stroke workup including MRI/MRA and echo as well as Dopplers.  4. Type 2 diabetes: Continue sliding scale insulin  5. Essential hypertension: Allow permissive hypertension.  6. Hyperlipidemia: LDL could not be calculated due to high triglyceride level. Continue statin and patient would benefit from fish oil.     Management plans discussed with the patient  and he is in agreement.  CODE STATUS: full  TOTAL TIME TAKING CARE OF THIS PATIENT: 30 minutes.     POSSIBLE D/C 1 day, DEPENDING ON CLINICAL CONDITION.   Amor Packard M.D on 09/03/2015 at 11:32 AM  Between 7am to 6pm - Pager - [phone] After 6pm go to [email or web address] - password [email or web address] [facility]  [facility]  Office   [phone]  CC: Primary care physician; No primary care provider on file.  Note: This dictation was prepared with Dragon dictation along with smaller phrase technology. Any transcriptional errors that result from this process are unintentional.

## 2015-09-03 NOTE — Progress Notes (Signed)
Per Dr. Juliene PinaMody okay to order DG one view abd x-ray to rule out any metal prior to MRI.

## 2015-09-03 NOTE — Progress Notes (Signed)
Patient complained of chest pain. Administered 1 nitro SL, administered oxygen at 2L via nasal cannula. Patient now states he has no chest pain. Patient is asleep in bed. Will continue to monitor patient.

## 2015-09-03 NOTE — Progress Notes (Signed)
Hydralazine held R/T pt. HR running high, pharmacy aware and notifying Dr. Joneen Roachrosley for a new order. Will continue to monitor pt.

## 2015-09-03 NOTE — Progress Notes (Signed)
Informed Dr. Anne HahnWillis that patient was c/o rt sided pain... New order : tylenol q6hrs
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
## 2015-09-03 NOTE — Progress Notes (Signed)
Per Glucometer CBG was 276

## 2015-09-04 ENCOUNTER — Inpatient Hospital Stay: Payer: Self-pay

## 2015-09-04 LAB — GLUCOSE, CAPILLARY
GLUCOSE-CAPILLARY: 317 mg/dL — AB (ref 65–99)
GLUCOSE-CAPILLARY: 238 mg/dL — AB (ref 65–99)
Glucose-Capillary: 209 mg/dL — ABNORMAL HIGH (ref 65–99)
Glucose-Capillary: 165 mg/dL — ABNORMAL HIGH (ref 65–99)
Glucose-Capillary: 189 mg/dL — ABNORMAL HIGH (ref 65–99)

## 2015-09-04 MED ORDER — ATORVASTATIN CALCIUM 20 MG PO TABS
40.0000 mg | ORAL_TABLET | Freq: Every day | ORAL | Status: DC
  Administered 2015-09-04: 40 mg via ORAL
  Filled 2015-09-04: qty 2

## 2015-09-04 MED ORDER — IPRATROPIUM-ALBUTEROL 0.5-2.5 (3) MG/3ML IN SOLN
3.0000 mL | RESPIRATORY_TRACT | Status: DC
  Administered 2015-09-04: 3 mL via RESPIRATORY_TRACT
  Filled 2015-09-04 (×2): qty 3

## 2015-09-04 MED ORDER — DM-GUAIFENESIN ER 30-600 MG PO TB12
1.0000 | ORAL_TABLET | Freq: Two times a day (BID) | ORAL | Status: DC
  Filled 2015-09-04 (×2): qty 1

## 2015-09-04 MED ORDER — GUAIFENESIN ER 600 MG PO TB12
600.0000 mg | ORAL_TABLET | Freq: Two times a day (BID) | ORAL | Status: DC
  Administered 2015-09-04 – 2015-09-05 (×3): 600 mg via ORAL
  Filled 2015-09-04 (×3): qty 1

## 2015-09-04 MED ORDER — METOPROLOL TARTRATE 25 MG PO TABS
25.0000 mg | ORAL_TABLET | Freq: Two times a day (BID) | ORAL | Status: DC
  Administered 2015-09-04 (×2): 25 mg via ORAL
  Filled 2015-09-04 (×2): qty 1

## 2015-09-04 MED ORDER — ATORVASTATIN CALCIUM 40 MG PO TABS: 40.0000 mg | ORAL_TABLET | Freq: Every day | ORAL | Status: AC

## 2015-09-04 MED ORDER — OMEGA-3-ACID ETHYL ESTERS 1 G PO CAPS: 1.0000 g | ORAL_CAPSULE | Freq: Two times a day (BID) | ORAL | Status: AC

## 2015-09-04 MED ORDER — ASPIRIN 81 MG PO TABS: 81.0000 mg | ORAL_TABLET | Freq: Every day | ORAL | Status: AC

## 2015-09-04 MED ORDER — IPRATROPIUM-ALBUTEROL 0.5-2.5 (3) MG/3ML IN SOLN
3.0000 mL | Freq: Four times a day (QID) | RESPIRATORY_TRACT | Status: DC
  Administered 2015-09-05 (×3): 3 mL via RESPIRATORY_TRACT
  Filled 2015-09-04 (×3): qty 3

## 2015-09-04 MED ORDER — DEXTROMETHORPHAN POLISTIREX ER 30 MG/5ML PO SUER
30.0000 mg | Freq: Two times a day (BID) | ORAL | Status: DC
  Administered 2015-09-04 – 2015-09-05 (×3): 30 mg via ORAL
  Filled 2015-09-04 (×8): qty 5

## 2015-09-04 MED ORDER — INFLUENZA VAC SPLIT QUAD 0.5 ML IM SUSY
0.5000 mL | PREFILLED_SYRINGE | INTRAMUSCULAR | Status: AC
  Administered 2015-09-05: 0.5 mL via INTRAMUSCULAR
  Filled 2015-09-04: qty 0.5

## 2015-09-04 MED ORDER — OMEGA-3-ACID ETHYL ESTERS 1 G PO CAPS
1.0000 g | ORAL_CAPSULE | Freq: Two times a day (BID) | ORAL | Status: DC
  Administered 2015-09-04 – 2015-09-05 (×3): 1 g via ORAL
  Filled 2015-09-04 (×3): qty 1

## 2015-09-04 MED ORDER — NICOTINE 14 MG/24HR TD PT24: 14.0000 mg | MEDICATED_PATCH | Freq: Every day | TRANSDERMAL | Status: AC

## 2015-09-04 NOTE — Progress Notes (Signed)
Inpatient Diabetes Program Recommendations  AACE/ADA: New Consensus Statement on Inpatient Glycemic Control (2015)  Target Ranges:  Prepandial:   less than 140 mg/dL      Peak postprandial:   less than 180 mg/dL (1-2 hours)      Critically ill patients:  140 - 180 mg/dL   Review of Glycemic Control:  Results for Rodney [name], Rodney [name]. (MRN [id number]) as of 09/04/2015 12:41  Ref. Range 09/03/2015 17:56 09/03/2015 20:48 09/03/2015 23:11 09/04/2015 05:11 09/04/2015 12:05  Glucose-Capillary Latest Ref Range: 65-99 mg/dL 629204 (H) 528253 (H) 413209 (H) 165 (H) 317 (H)    Diabetes history: Type 2 diabetes Outpatient Diabetes medications: Lantus 28 units at HS, Novolin 70/30 40 units bid-this is per medication reconcilaition Current orders for Inpatient glycemic control:  Novolog sensitive q 6 hours  Inpatient Diabetes Program Recommendations:    Please consider restarting Lantus 28 units daily. Would not restart Novolin 70/30 at this time. Also consider changing Novolog correction scale to moderate tid with meals and HS.  Patient would benefit from CHO modified diet as well.  Note plans for d/c to SNF.    Thanks, Beryl MeagerJenny Carmesha Morocco, RN, BC-ADM Inpatient Diabetes Coordinator Pager [phone] (8a-5p)
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
## 2015-09-04 NOTE — Progress Notes (Signed)
CBG 165,  2 units of insulin given for coverage

## 2015-09-04 NOTE — Progress Notes (Signed)
Patient seen again this afternoon with famiyl at bedisde. Rodney CharYhey are concerned about his discharge plan and lack of insurance. I discussed all options to them and explained to then that we will try to get him to rehab and we understand their frustration. I also mentioned [facility] would [facility] be a [facility] as their is not a medical need for this transfer.  Patient also voicing that he is having pain when he coughs.  EXAM VSS 98% RA CVS r,r,r, no m.g.r LUNGS no wheezing crackles rales  ABD BS+ NT ND EXT no edema NEURO unchanged exam from this am  Discussed with case management adn family in agreement with plan.  Time spent 35 minutes
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
## 2015-09-04 NOTE — Progress Notes (Signed)
Speech Therapy Note: received order, reviewed chart notes; consulted NSG and MD re: pt's status who report pt is at his baseline now(post discussion w/ brother). Pt is not having any trouble swallowing and is currently eating a regular diet and taking meds w/ liquids w/out s/s of aspiration per NSG and pt. Pt is communicating at his baseline per report. MD agreed w/ holding on any ST evaluation at this time. NSG/MD to reconsult ST services if any decline in pt's status.

## 2015-09-04 NOTE — Progress Notes (Signed)
Physical Therapy Treatment Patient Details Name: Rodney [name]. MRN: [id number] DOB: Dec 22, 1965 Today's Date: 09/04/2015    History of Present Illness Patient is a 49 y.o. male admitted to ED on 29 Oct. after being found on street next to scooter unconscious. Upon admission, patient was found to have alcohol in system as well as possible acute on chronic CVA. PMH includes DM, HTN, and hyperlipidemia.    PT Comments    Pt is challenging due to chronic CVA 2 years ago with residual R side deficits. Difficult to determine which deficits are new/worse with current CVA compared to prior CVA. Family reports considerable decline in mobility and cognition over the last couple weeks. Pt is relatively unstable with ambulation requiring minA+1 assistance due to balance and RLE weakness. He ambulates with R steppage gait, circumducting RLE to account for R foot drop. He has RLE clonus in plantarflexors which does not fatigue until stretch is removed. He is able to answer yes/no questions relatively appropriately (approximately 80% accurate) but has considerable expressive aphasia. Command follow approximately 75% of the time and unclear the degree of receptive aphasia vs motor planning difficulty. Pt is fatigued by the end of his ambulation distance and family has concerns that he is not at his baseline mobility. Pt needs SNF placement as well as OT and SLP in order to return to prior level of function.  Pt will benefit from skilled PT services to address deficits in strength, balance, and mobility in order to return to full function at home.    Follow Up Recommendations  SNF     Equipment Recommendations  Other (comment) (Hemiwalker)    Recommendations for Other Services       Precautions / Restrictions Precautions Precautions: Fall Restrictions Weight Bearing Restrictions: No    Mobility  Bed Mobility Overal bed mobility: Needs Assistance Bed Mobility: Supine to Sit;Sit to Supine      Supine to sit: Min guard Sit to supine: Min assist   General bed mobility comments: Pt with difficulty flexing R hip to return back to bed. During supine to sit he relies heavily on LUE with bed rails and HOB elevated  Transfers Overall transfer level: Needs assistance Equipment used: Rolling walker (2 wheeled) Transfers: Sit to/from Stand Sit to Stand: Min guard Stand pivot transfers: Independent       General transfer comment: Pt able to perform sit to stand but decreased weight shift to RLE noted. Practiced sit to stand x 5 with patient without UE support.  Ambulation/Gait Ambulation/Gait assistance: Min assist Ambulation Distance (Feet): 30 Feet Assistive device: Rolling walker (2 wheeled) (Would perform better with hemiwalker) Gait Pattern/deviations: Step-to pattern;Steppage;Decreased dorsiflexion - right;Decreased weight shift to right Gait velocity: Decreased Gait velocity interpretation: <1.8 ft/sec, indicative of risk for recurrent falls General Gait Details: Pt with R foot drop noted. R foot passively dorsiflexes to approximately neutral in weightbearing. Pt with circumduction and steppage gait with RLE. Poor R hip flexion noted as well. MinA+1 support due to poor balance with guarding on the R side. Pt provided cues for sequencing and assist to apply RUE to walker due to spastic R grip. RUE rigidity and spasticity noted with flexed R elbow posturing. Pt fatigued at end of ambulation distance. Family reports that gait is currently worse than baseline over the last 2 weeks.   Stairs            Wheelchair Mobility    Modified Rankin (Stroke Patients Only)  Balance Overall balance assessment: Needs assistance   Sitting balance-Leahy Scale: Fair     Standing balance support: Single extremity supported Standing balance-Leahy Scale: Fair Standing balance comment: Pt able to maintain standing balance without UE support                    Cognition  Arousal/Alertness: Awake/alert Behavior During Therapy: WFL for tasks assessed/performed Overall Cognitive Status: Impaired/Different from baseline Area of Impairment: Orientation Orientation Level: Disoriented to;Time;Situation             General Comments: Family reports he is cognitively impaired at baseline but currently worse    Exercises General Exercises - Lower Extremity Long Arc Quad: Strengthening;Right;10 reps;Seated Hip Flexion/Marching: Strengthening;Right;15 reps;Seated Other Exercises Other Exercises: Sit to stand x 5 with cues for proper sequencing and no UE support    General Comments        Pertinent Vitals/Pain Pain Assessment: No/denies pain    Home Living                      Prior Function            PT Goals (current goals can now be found in the care plan section) Acute Rehab PT Goals Patient Stated Goal: Unable to state PT Goal Formulation: Patient unable to participate in goal setting (With family) Time For Goal Achievement: 09/17/15 Potential to Achieve Goals: Fair Progress towards PT goals: Progressing toward goals    Frequency  7X/week    PT Plan Current plan remains appropriate    Co-evaluation             End of Session Equipment Utilized During Treatment: Gait belt Activity Tolerance: Patient limited by fatigue Patient left: in bed;with call bell/phone within reach;with bed alarm set;with family/visitor present     Time: [id number] PT Time Calculation (min) (ACUTE ONLY): 17 min  Charges:  $Gait Training: 8-22 mins                    G Codes:  Functional Assessment Tool Used: 3   Sharalyn InkJason D [name] PT, DPT   [name],Rodney 09/04/2015, 5:05 PM

## 2015-09-04 NOTE — Care Management (Signed)
Physical therapy has assessed today and again recommended skilled nursing placement.  have also asked [facility] to assess for charity care.  There may be a problem because patient will not be applying for medicare.  Informed [facility] that patient had medicaid and it was terminated when patient got approved for and began receiving disability- so there is no need to reapply. Patient receives 1700 dollars a month disability.  Family - step father- is very verbal about patient "not doing anything if he stays at home." Discussed that patient would have to invest in the plan of care to benefit.  Family very firm in their belief that patient "needs to go somewhere to get help everyday."

## 2015-09-04 NOTE — Clinical Social Work Note (Signed)
Clinical Social Work Assessment  Patient Details  Name: Lou CalRoger D Arseneault Jr. MRN: [id number] Date of Birth: 21-Jul-1966  Date of referral:  09/04/15               Reason for consult:  Facility Placement                Permission sought to share information with:    Permission granted to share information::     Name::        Agency::     Relationship::     Contact Information:     Housing/Transportation Living arrangements for the past 2 months:  Single Family Home Source of Information:  Parent Patient Interpreter Needed:  None Criminal Activity/Legal Involvement Pertinent to Current Situation/Hospitalization:  No - Comment as needed Significant Relationships:    Lives with:  Parents Do you feel safe going back to the place where you live?    Need for family participation in patient care:  Yes (Comment)  Care giving concerns:  CSW informed by RN CM that the MD wants to discharge patient today and that he has been recommended for STR. Patient lives with his mother and stepfather.  Social Worker assessment / plan:  CSW attempted to meet with patient this morning and he was alone. Patient was not able to formulate answers to CSW questions. Patient kept repeating phrases, "you don't know" "yep" and when given a choice of words as to where he was currently located, patient chose "home" as his answer. Patient was not able to write his answers out for CSW either. CSW checked back later and patient's mother, stepfather and cousin were visiting. As CSW entered patient's room, patient was standing up and ambulating within his room. Patient's stepfather stated that patient lives with them. Patient's stepfather and mother stated that they would take patient back home if they needed to but they were hoping patient could go to a facility for physical therapy rehab. CSW explained that patient would have to reapply for medicaid or they would need to on his behalf and that he would need to be reassessed by  PT as patient was standing and ambulating in his room at this time.    Employment status:  Disabled (Comment on whether or not currently receiving Disability) Insurance information:  Self Pay (Medicaid Pending) PT Recommendations:  Skilled Nursing Facility Information / Referral to community resources:     Patient/Family's Response to care:  Patient's stepfather very frustrated that he has not been able to get speech therapy for patient or PT in the past.  Patient/Family's Understanding of and Emotional Response to Diagnosis, Current Treatment, and Prognosis:  Patient's stepfather states that he will take patient back in the home if they need to.  Emotional Assessment Appearance:  Appears stated age Attitude/Demeanor/Rapport:   (pleasant) Affect (typically observed):    Orientation:    Alcohol / Substance use:  Alcohol Use Psych involvement (Current and /or in the community):  No (Comment)  Discharge Needs  Concerns to be addressed:  Care Coordination Readmission within the last 30 days:  No Current discharge risk:  Substance Abuse, Physical Impairment Barriers to Discharge:  No Barriers Identified, Active Substance Use   York SpanielMonica Raymond Bhardwaj, LCSW 09/04/2015, 2:58 PM

## 2015-09-04 NOTE — Evaluation (Signed)
Occupational Therapy Evaluation Patient Details Name: Rodney CalRoger D Mccadden Jr. MRN: [id number] DOB: 01/05/1966 Today's Date: 09/04/2015    History of Present Illness Patient is a 49 y.o. male admitted to ED on 29 Oct. after being found on street next to scooter unconscious. Upon admission, patient was found to have alcohol in system as well as possible acute on chronic CVA. PMH includes DM, HTN, and hyperlipidemia.   Clinical Impression   Pt seen for OT evaluation and presents with a hx of prior CVAs and RUE and hand with minimal active movement in R shoulder with a lot of trunk compensation when trying to move fingers or UE.  He has increased flexor tone in R hand and has a resting hand splint at home that he should be wearing.  He is able to use his left hand to feed himself and open a juice container but needs assist for cutting up meat and opening small containers like butter and salt.  He requires min assist for UB dressing and mod for LB dressing skills and is impulsive and needs cues to slow down.  He is able to state one to two words and yes and no to questions and has expressive aphasia and decreased cognition which his parents state is not new.  Discussed use of elastic shoe laces, built up utensil holder and use of the resting hand splint and shower chair with back and use the shower stall in his father's bathroom that has grab bars to prevent falls at home.  Rec continued OT services for ADL retraining, home exercise program to prevent skin breakdown and further contractures.  Rec SNF for continued rehab.         Follow Up Recommendations  SNF    Equipment Recommendations   (rec using the shower chair and R hand splint he has at home)    Recommendations for Other Services       Precautions / Restrictions Precautions Precautions: Fall Restrictions Weight Bearing Restrictions: No      Mobility Bed Mobility Overal bed mobility: Needs Assistance Bed Mobility: Supine to Sit;Sit to  Supine     Supine to sit: Min guard Sit to supine: Min assist   General bed mobility comments: Pt with difficulty flexing R hip to return back to bed. During supine to sit he relies heavily on LUE with bed rails and HOB elevated  Transfers Overall transfer level: Needs assistance Equipment used: Rolling walker (2 wheeled) Transfers: Sit to/from Stand Sit to Stand: Min guard Stand pivot transfers: Independent       General transfer comment: Pt able to perform sit to stand but decreased weight shift to RLE noted. Practiced sit to stand x 5 with patient without UE support.    Balance Overall balance assessment: Needs assistance   Sitting balance-Leahy Scale: Fair     Standing balance support: Single extremity supported Standing balance-Leahy Scale: Fair Standing balance comment: Pt able to maintain standing balance without UE support                            ADL Overall ADL's : Needs assistance/impaired                                       General ADL Comments: Pt presents with decreased functional use of RUE and hand which was present prior to admission and  has tight flexion in wrist and fingers, min assist for feeding self with L hand, mod assist for LB dressing skills  and min assist for UB dresssing due to decredased function of RUE which is dominant hand.  Rec he use his father's showe stall that has a grab bar and use the shower chair with back that he has at home and has not been using.       Vision     Perception     Praxis      Pertinent Vitals/Pain Pain Assessment: No/denies pain     Hand Dominance Right   Extremity/Trunk Assessment Upper Extremity Assessment Upper Extremity Assessment: RUE deficits/detail RUE Deficits / Details: Sensation inconsistent when assessed; increased flexion contractures in R hand and has a splint at home but has not been wearing it.           Communication Communication Communication:  Expressive difficulties   Cognition Arousal/Alertness: Awake/alert Behavior During Therapy: WFL for tasks assessed/performed Overall Cognitive Status: Impaired/Different from baseline Area of Impairment: Orientation;Problem solving;Awareness Orientation Level: Disoriented to;Time;Situation   Memory: Decreased short-term memory       Problem Solving: Slow processing;Decreased initiation;Requires verbal cues General Comments: Family reports he is cognitively impaired at baseline but currently worse   General Comments       Exercises Exercises: General Lower Extremity;Other exercises Other Exercises Other Exercises: Sit to stand x 5 with cues for proper sequencing and no UE support   Shoulder Instructions      Home Living Family/patient expects to be discharged to:: Private residence Living Arrangements: Parent Available Help at Discharge: Family Type of Home: House       Home Layout: One level     Bathroom Shower/Tub: Walk-in shower;Tub/shower unit (his father has a walk in shower with grab bar that is rec for pt to use vs tub with no seat or grab bar) Shower/tub characteristics: Engineer, building services: Standard Bathroom Accessibility: No   Home Equipment: None          Prior Functioning/Environment Level of Independence: Independent        Comments: Pt's parents present for evaluation and state that he has had decreased movement in RUE and hand for 2 years and lives at home with them but was doing his own thing.    OT Diagnosis: Cognitive deficits;Hemiplegia dominant side;Paresis;Generalized weakness   OT Problem List: Decreased strength;Decreased range of motion;Impaired UE functional use;Decreased coordination;Decreased safety awareness;Decreased activity tolerance;Impaired balance (sitting and/or standing)   OT Treatment/Interventions: Self-care/ADL training;Therapeutic exercise;Neuromuscular education;Patient/family education;Therapeutic activities    OT  Goals(Current goals can be found in the care plan section) Acute Rehab OT Goals Patient Stated Goal: Unable to state but parents stated they want him to go to rehab so he can help take care of himself again OT Goal Formulation: With patient/family Time For Goal Achievement: 09/18/15 Potential to Achieve Goals: Good ADL Goals Pt Will Perform Eating: with min assist;with adaptive utensils;sitting Pt Will Perform Grooming: with min assist;with adaptive equipment;sitting Pt Will Perform Upper Body Dressing: with min assist;sitting Pt Will Perform Lower Body Dressing: with min assist;sit to/from stand Pt/caregiver will Perform Home Exercise Program: Right Upper extremity;With written HEP provided  OT Frequency: Min 1X/week   Barriers to D/C:            Co-evaluation              End of Session Equipment Utilized During Treatment:  Secretary/administrator) Nurse Communication:  (rec bedside swallow eval  if he continues to cough when eating and drinking)  Activity Tolerance: Patient tolerated treatment well Patient left: in bed;with call bell/phone within reach;with bed alarm set;with family/visitor present   Time: 1715-1750 OT Time Calculation (min): 35 min Charges:  OT General Charges $OT Visit: 1 Procedure OT Evaluation $Initial OT Evaluation Tier I: 1 Procedure OT Treatments $Self Care/Home Management : 8-22 mins $Neuromuscular Re-education: 8-22 mins G-Codes:    Wofford,Susan 09/12/15, 5:58 PM  Susanne Borders, OTR/L

## 2015-09-04 NOTE — Progress Notes (Signed)
Per glucometer CBG 317.

## 2015-09-04 NOTE — NC FL2 (Addendum)
[place] MEDICAID FL2 LEVEL OF CARE SCREENING TOOL     IDENTIFICATION  Patient Name: Rodney CalRoger D Demicco Jr. Birthdate: 11/14/1965 Sex: male Admission Date (Current Location): 09/02/2015  [place]   Facility and Address:  [facility], [address], [place], [place] [address]      Provider Number: [id number]  Attending Physician Name and Address:  Adrian SaranSital Mody, MD  Relative Name and Phone Number:       Current Level of Care: Hospital Recommended Level of Care: ALF Prior Approval Number:    Date Approved/Denied:   PASRR Number:   [id number]    Discharge Plan: SNF    Current Diagnoses: Patient Active Problem List   Diagnosis Date Noted  . Unresponsive episode 09/02/2015  . Alcohol intoxication (HCC) 09/02/2015  . Stroke (HCC) 09/02/2015  . Type 2 diabetes mellitus (HCC) 09/02/2015  . HTN (hypertension) 09/02/2015  . HLD (hyperlipidemia) 09/02/2015  . GERD (gastroesophageal reflux disease) 09/02/2015    Orientation ACTIVITIES/SOCIAL BLADDER RESPIRATION    Self  Active Incontinent Normal  BEHAVIORAL SYMPTOMS/MOOD NEUROLOGICAL BOWEL NUTRITION STATUS   (none) Convulsions/Seizures Continent Diet  PHYSICIAN VISITS COMMUNICATION OF NEEDS Height & Weight Skin  30 days Verbally   177 lbs. Normal          AMBULATORY STATUS RESPIRATION    Assist extensive Normal      Personal Care Assistance Level of Assistance  Bathing, Dressing, Feeding Bathing Assistance: Limited assistance Feeding assistance: Limited assistance Dressing Assistance: Limited assistance      Functional Limitations Info  Speech     Speech Info: Impaired       SPECIAL CARE FACTORS FREQUENCY  PT (By licensed PT), OT (By licensed OT), Speech therapy                   Additional Factors Info  Code Status, Allergies Code Status Info: full Allergies Info: zinc              DISCHARGE MEDICATIONS:   Current Discharge  Medication List    START taking these medications   Details  nicotine (NICODERM CQ) 14 mg/24hr patch Place 1 patch (14 mg total) onto the skin daily. Qty: 28 patch, Refills: 0    omega-3 acid ethyl esters (LOVAZA) 1 G capsule Take 1 capsule (1 g total) by mouth 2 (two) times daily. Qty: 60 capsule, Refills: 0      CONTINUE these medications which have CHANGED   Details  aspirin 81 MG tablet Take 1 tablet (81 mg total) by mouth daily. Takes 1 tablets daily. Qty: 30 tablet, Refills: 0    atorvastatin (LIPITOR) 40 MG tablet Take 1 tablet (40 mg total) by mouth at bedtime. Qty: 30 tablet, Refills: 0      CONTINUE these medications which have NOT CHANGED   Details  clopidogrel (PLAVIX) 75 MG tablet Take 75 mg by mouth daily.    docusate sodium (COLACE) 100 MG capsule Take 1 capsule by mouth 2 (two) times daily.    famotidine (PEPCID) 20 MG tablet Take 1 tablet by mouth 2 (two) times daily.    furosemide (LASIX) 20 MG tablet Take 20 mg by mouth daily.    gabapentin (NEURONTIN) 100 MG capsule Take 1-3 capsules by mouth See admin instructions. Take 3 capsules po qam and 1 capsule qhs    gemfibrozil (LOPID) 600 MG tablet Take 600 mg by mouth 2 (two) times daily.    hydrALAZINE (APRESOLINE) 50 MG tablet Take  50 mg by mouth 4 (four) times daily.         insulin NPH-insulin regular (NOVOLIN 70/30) (70-30) 100 UNIT/ML injection Inject 40 Units into the skin 2 (two) times daily.    lisinopril (PRINIVIL,ZESTRIL) 20 MG tablet Take 20 mg by mouth daily.    metoprolol (LOPRESSOR) 50 MG tablet Take 50 mg by mouth 2 (two) times daily.    traZODone (DESYREL) 50 MG tablet Take 50 mg by mouth at bedtime.      STOP taking these medications     naproxen (NAPROSYN) 500 MG tablet                  Additional Information    York Spaniel, LCSW

## 2015-09-05 LAB — GLUCOSE, CAPILLARY
GLUCOSE-CAPILLARY: 274 mg/dL — AB (ref 65–99)
Glucose-Capillary: 184 mg/dL — ABNORMAL HIGH (ref 65–99)
Glucose-Capillary: 226 mg/dL — ABNORMAL HIGH (ref 65–99)

## 2015-09-05 MED ORDER — TUBERCULIN PPD 5 UNIT/0.1ML ID SOLN
5.0000 [IU] | Freq: Once | INTRADERMAL | Status: DC
  Administered 2015-09-05: 5 [IU] via INTRADERMAL
  Filled 2015-09-05: qty 0.1

## 2015-09-05 MED ORDER — METOPROLOL TARTRATE 50 MG PO TABS
50.0000 mg | ORAL_TABLET | Freq: Two times a day (BID) | ORAL | Status: DC
  Administered 2015-09-05: 50 mg via ORAL
  Filled 2015-09-05: qty 1

## 2015-09-05 MED ORDER — GEMFIBROZIL 600 MG PO TABS
600.0000 mg | ORAL_TABLET | Freq: Two times a day (BID) | ORAL | Status: DC
  Administered 2015-09-05: 600 mg via ORAL
  Filled 2015-09-05: qty 1

## 2015-09-05 MED ORDER — FUROSEMIDE 20 MG PO TABS
20.0000 mg | ORAL_TABLET | Freq: Every day | ORAL | Status: DC
  Administered 2015-09-05: 20 mg via ORAL
  Filled 2015-09-05: qty 1

## 2015-09-05 MED ORDER — INSULIN ASPART PROT & ASPART (70-30 MIX) 100 UNIT/ML ~~LOC~~ SUSP
25.0000 [IU] | Freq: Two times a day (BID) | SUBCUTANEOUS | Status: DC
  Administered 2015-09-05 (×2): 25 [IU] via SUBCUTANEOUS
  Filled 2015-09-05 (×2): qty 25

## 2015-09-05 MED ORDER — LISINOPRIL 20 MG PO TABS
20.0000 mg | ORAL_TABLET | Freq: Every day | ORAL | Status: DC
  Administered 2015-09-05: 20 mg via ORAL
  Filled 2015-09-05: qty 1

## 2015-09-05 MED ORDER — HYDRALAZINE HCL 50 MG PO TABS
50.0000 mg | ORAL_TABLET | Freq: Four times a day (QID) | ORAL | Status: DC
  Administered 2015-09-05 (×3): 50 mg via ORAL
  Filled 2015-09-05 (×3): qty 1

## 2015-09-05 NOTE — Clinical Social Work Note (Signed)
Patient's mother and stepfather [phone]) were unreachable today as their phone numbers have been disconnected. Nann, the RN CM, was able to reach patient's brother a little after noon time and made him aware patient was discharged and that they needed to come to the hospital to decide if they were going to go with the family care home or take patient home. Patient's family has yet to arrive. If patient's family arrives after hours, they will need to take patient home and can follow up as an outpatient with Jimmye NormanLawanda Ray and [facility] male bed [facility] Number will be provided to family.  York SpanielMonica Jomari Bartnik MSW,LCSW [phone]

## 2015-09-05 NOTE — Discharge Planning (Addendum)
Pt IV and tele will be removed once family arrives to discharge pt.  DC papers given, explained and educated.  Pt told of suggested FU appts and also given scripts.  VSS and RN assessment revealed stability for DC.  Family has been notified of DC and agreed to come to make decisions for DC and transport.  Still waiting on family to arrive. Social wk indicated that family is on way to hospital. Family given Contact name and phone for " [facility]". Also given documentation of TB vaccine given in hospital.  Family will take pt home for now - and consider contacting [facility] to place from home.  Family also informed that a male bed is available today, but is not being held.  If decided, they will need to contact and see if a male bed is still available.  Pt will be wheeled to front and family will be transporting home via car.

## 2015-09-05 NOTE — Progress Notes (Signed)
Physical Therapy Treatment Patient Details Name: Rodney [name]. MRN: [id number] DOB: 03/23/1966 Today's Date: 09/05/2015    History of Present Illness Patient is a 49 y.o. male admitted to ED on 29 Oct. after being found on street next to scooter unconscious. Upon admission, patient was found to have alcohol in system as well as possible acute on chronic CVA. PMH includes DM, HTN, and hyperlipidemia.    PT Comments    Pt agreeable to PT. Pt demonstrates poor ambulation quality and safety requiring assist to maneuver rolling walker due to inability to control RUE. Pt would benefit from R platform rolling walker initially progressing to R hemiwalker as strength and coordination on right lower extremity improves. Pt demonstrates difficulty following instructions with some exercises in stand and poor safety awareness in stand/ambulation with regard to R hemibody. Pt has discharge orders today.  Follow Up Recommendations  SNF     Equipment Recommendations   (R platform rw with progression to hemiwalker)    Recommendations for Other Services       Precautions / Restrictions Precautions Precautions: Fall Restrictions Weight Bearing Restrictions: No    Mobility  Bed Mobility Overal bed mobility: Needs Assistance Bed Mobility: Sit to Supine       Sit to supine: Min assist   General bed mobility comments: Min A for RLE hip flexion to return to bed with min A and momentum, pt able to return to supine; use LUE to pull self upward in bed  Transfers Overall transfer level: Needs assistance Equipment used: Rolling walker (2 wheeled) Transfers: Sit to/from Stand Sit to Stand: Min assist;Min guard (Min guard to stand; min A for placement of RUE on rw)         General transfer comment: Wide base of support; needs cues to be aware of RLE and placment within rw and assist to place RUE on rw  Ambulation/Gait Ambulation/Gait assistance: Min assist Ambulation Distance (Feet): 40  Feet Assistive device: Rolling walker (2 wheeled) (would benefit from R platform rw) Gait Pattern/deviations: Step-to pattern;Decreased step length - right;Decreased dorsiflexion - right;Wide base of support Gait velocity: Decreased Gait velocity interpretation: Below normal speed for age/gender General Gait Details: Requires assist to maneuver rw, as unable to manage with RUE; cues for keeping RLE within rw. Decreased hip/knee and ankle flexion on RLE   Stairs            Wheelchair Mobility    Modified Rankin (Stroke Patients Only)       Balance Overall balance assessment: Needs assistance         Standing balance support: Single extremity supported Standing balance-Leahy Scale: Fair Standing balance comment: can stand briefly to allow for sit without UE support                    Cognition Arousal/Alertness: Awake/alert Behavior During Therapy: WFL for tasks assessed/performed Overall Cognitive Status: Impaired/Different from baseline Area of Impairment: Orientation;Following commands;Safety/judgement;Awareness Orientation Level: Disoriented to;Place;Time;Situation   Memory: Decreased short-term memory Following Commands: Follows one step commands inconsistently Safety/Judgement: Decreased awareness of safety   Problem Solving: Slow processing;Requires verbal cues;Difficulty sequencing;Requires tactile cues General Comments: Poor awareness of RLE and RUE with stand/ambulation.     Exercises General Exercises - Lower Extremity Ankle Circles/Pumps: AROM;Left;10 reps;Seated (unable on R actively) Long [employer]: Strengthening;Right;10 reps;Seated (partial range actively; rigid movement) Hip ABduction/ADduction: AROM;Right;10 reps;Standing Straight Leg Raises: AROM;Right;10 reps;Standing Hip Flexion/Marching: AROM;Right;10 reps;Seated;Standing (performed 10x each postition) Other Exercises Other Exercises: R  hip extension 10x stand    General Comments         Pertinent Vitals/Pain Pain Assessment: Faces Faces Pain Scale: Hurts whole lot Pain Location: R thigh Pain Descriptors / Indicators:  (unable to describe; just yells out 3-4 times during session) Pain Intervention(s): Monitored during session    Home Living                      Prior Function            PT Goals (current goals can now be found in the care plan section) Progress towards PT goals: Progressing toward goals    Frequency  7X/week    PT Plan Current plan remains appropriate    Co-evaluation             End of Session Equipment Utilized During Treatment: Gait belt Activity Tolerance: Patient limited by pain;Patient limited by fatigue Patient left: in bed;with call bell/phone within reach;with bed alarm set;with nursing/sitter in room     Time: 1107-1131 PT Time Calculation (min) (ACUTE ONLY): 24 min  Charges:  $Gait Training: 8-22 mins $Therapeutic Exercise: 8-22 mins                    G Codes:      Kristeen MissHeidi Elizabeth Bishop 09/05/2015, 11:54 AM

## 2015-09-05 NOTE — Progress Notes (Signed)
Patient has rested quietly tonight. No complaints of pain and no signs of discomfort or distress noted. Nursing staff will continue to monitor. Emmarie Sannes A Travin Marik, RN 

## 2015-09-05 NOTE — Discharge Summary (Signed)
[facility] [place] [facility]   PATIENT NAME: Rodney PeersRoger [name]    MR#:  [id number]  DATE OF BIRTH:  Jul 17, 1966  DATE OF ADMISSION:  09/02/2015 ADMITTING PHYSICIAN: Oralia Manisavid Willis, MD  DATE OF DISCHARGE: 09/05/2015 PRIMARY CARE PHYSICIAN: No primary care provider on file.    ADMISSION DIAGNOSIS:  Alcohol intoxication, with delirium (HCC) [F10.121] Cerebral infarction due to unspecified mechanism [I63.9]  DISCHARGE DIAGNOSIS:  Principal Problem:   Unresponsive episode Active Problems:   Alcohol intoxication (HCC)   Stroke (HCC)   Type 2 diabetes mellitus (HCC)   HTN (hypertension)   HLD (hyperlipidemia)   SECONDARY DIAGNOSIS:   Past Medical History  Diagnosis Date  . Diabetes mellitus     Type II  . History of depression   . Polysubstance abuse   . Coronary artery disease 2013    S/P STENT PLACEMENT/CARDIAC CATHETERIZAITION  . Hyperlipidemia   . History of pancreatitis   . Hypertension   . GERD (gastroesophageal reflux disease)   . OSA (obstructive sleep apnea)     HOSPITAL COURSE:   49 year old male with past medical history significant for stroke, diabetes and CAD who was found in the street by a scooter and was unresponsive.   1. Acute encephalopathy: This was due to acute EtOH intoxication and new stroke as suggested by CT scan. Patient appears to be at his baseline.   2. EtOH intoxication: Patient had uneventful detox. Encouraged to stop drinking EtOh.  3.  Right subacute lacunar infarct CVA: CT scan shows old infarct with a new area possible stroke. Neurology consultation is appreciated. This is likley due to uncontrolled HTN. Patient could not obtain MRI due to metal. ECHO and dopplers are unremarkable and do not show etiology of CVA. Marland Kitchen.  4. Type 2 diabetes: Continue sliding scale insulin  5. Essential hypertension: Restart blood pressure medications and needs close outpatient follow up  6. Hyperlipidemia: LDL could not be  calculated due to high triglyceride level. Continue statin and GEMFIBROZIL as well as OMEGA 3 fish oil.  DISCHARGE CONDITIONS AND DIET:  Stable for discharge Heart healthy diabetic diet  CONSULTS OBTAINED:     DRUG ALLERGIES:   Allergies  Allergen Reactions  . Zinc     DISCHARGE MEDICATIONS:   Current Discharge Medication List    START taking these medications   Details  nicotine (NICODERM CQ) 14 mg/24hr patch Place 1 patch (14 mg total) onto the skin daily. Qty: 28 patch, Refills: 0    omega-3 acid ethyl esters (LOVAZA) 1 G capsule Take 1 capsule (1 g total) by mouth 2 (two) times daily. Qty: 60 capsule, Refills: 0      CONTINUE these medications which have CHANGED   Details  aspirin 81 MG tablet Take 1 tablet (81 mg total) by mouth daily. Takes 1 tablets daily. Qty: 30 tablet, Refills: 0    atorvastatin (LIPITOR) 40 MG tablet Take 1 tablet (40 mg total) by mouth at bedtime. Qty: 30 tablet, Refills: 0      CONTINUE these medications which have NOT CHANGED   Details  clopidogrel (PLAVIX) 75 MG tablet Take 75 mg by mouth daily.    docusate sodium (COLACE) 100 MG capsule Take 1 capsule by mouth 2 (two) times daily.    famotidine (PEPCID) 20 MG tablet Take 1 tablet by mouth 2 (two) times daily.    furosemide (LASIX) 20 MG tablet Take 20 mg by mouth daily.    gabapentin (NEURONTIN) 100 MG capsule Take  1-3 capsules by mouth See admin instructions. Take 3 capsules po qam and 1 capsule qhs    gemfibrozil (LOPID) 600 MG tablet Take 600 mg by mouth 2 (two) times daily.    hydrALAZINE (APRESOLINE) 50 MG tablet Take 50 mg by mouth 4 (four) times daily.         insulin NPH-insulin regular (NOVOLIN 70/30) (70-30) 100 UNIT/ML injection Inject 40 Units into the skin 2 (two) times daily.    lisinopril (PRINIVIL,ZESTRIL) 20 MG tablet Take 20 mg by mouth daily.    metoprolol (LOPRESSOR) 50 MG tablet Take 50 mg by mouth 2 (two) times daily.    traZODone (DESYREL) 50 MG  tablet Take 50 mg by mouth at bedtime.      STOP taking these medications     naproxen (NAPROSYN) 500 MG tablet               Today   CHIEF COMPLAINT:  No acute events overnight  VITAL SIGNS:  Blood pressure 126/85, pulse 85, temperature 97.6 F (36.4 C), temperature source Oral, resp. rate 18, height  (1.575 m), weight 80.287 kg (177 lb), SpO2 96 %.   REVIEW OF SYSTEMS:  Review of Systems  Constitutional: Negative for fever, chills and malaise/fatigue.  HENT: Negative for sore throat.   Eyes: Negative for blurred vision.  Respiratory: Negative for cough, hemoptysis, shortness of breath and wheezing.   Cardiovascular: Negative for chest pain, palpitations and leg swelling.  Gastrointestinal: Negative for nausea, vomiting, abdominal pain, diarrhea and blood in stool.  Genitourinary: Negative for dysuria.  Musculoskeletal: Negative for back pain.  Neurological: Positive for speech change and focal weakness. Negative for dizziness, tremors and headaches.  Endo/Heme/Allergies: Does not bruise/bleed easily.     PHYSICAL EXAMINATION:  GENERAL:  49 y.o.-year-old patient lying in the bed with no acute distress.  NECK:  Supple, no jugular venous distention. No thyroid enlargement, no tenderness.  LUNGS: Normal breath sounds bilaterally, no wheezing, rales,rhonchi  No use of accessory muscles of respiration.  CARDIOVASCULAR: S1, S2 normal. No murmurs, rubs, or gallops.  ABDOMEN: Soft, non-tender, non-distended. Bowel sounds present. No organomegaly or mass.  EXTREMITIES: No pedal edema, cyanosis, or clubbing.  PSYCHIATRIC: The patient is alert and oriented  SKIN: No obvious rash, lesion, or ulcer.  NEURO aphasic with right hemiplegia  DATA REVIEW:   CBC  Recent Labs Lab 09/02/15 2343  WBC 10.0  HGB 17.0  HCT 49.4  PLT 176    Chemistries   Recent Labs Lab 09/02/15 1959 09/02/15 2343  NA 135  --   K 3.8  --   CL 100*  --   CO2 23  --   GLUCOSE 322*   --   BUN 10  --   CREATININE 0.75 0.60*  CALCIUM 9.1  --   AST 24  --   ALT 29  --   ALKPHOS 91  --   BILITOT 0.2*  --     Cardiac Enzymes  Recent Labs Lab 09/02/15 2343 09/03/15 1056  TROPONINI <0.03 <0.03    Microbiology Results  @  RADIOLOGY:  Dg Abd 1 View  09/03/2015  CLINICAL DATA:  Evaluate for metal implant.  Clearance for MRI. EXAM: ABDOMEN - 1 VIEW COMPARISON:  None. FINDINGS: No radiopaque foreign bodies identified within the abdomen or pelvis. IMPRESSION: No radiopaque foreign bodies noted. Electronically Signed   By: Signa Kell M.D.   On: 09/03/2015 16:16   Mr Brain Wo Contrast  09/04/2015  CLINICAL  DATA:  49 year old diabetic hypertensive male with hyperlipidemia, polysubstance abuse and pancreatitis presenting with unresponsiveness 09/02/2015 at which time high alcohol level was noted. Subsequent encounter. EXAM: MRI HEAD WITHOUT CONTRAST MRA HEAD WITHOUT CONTRAST TECHNIQUE: Multiplanar, multiecho pulse sequences of the brain and surrounding structures were obtained without intravenous contrast. Angiographic images of the head were obtained using MRA technique without contrast. COMPARISON:  09/02/2015 head CT. FINDINGS: MRI HEAD FINDINGS Large left hemispheric infarct with encephalomalacia. Regions of restricted motion within portions of the left hemisphere infarct are felt most consistent with result of blood breakdown products/laminar necrosis from the remote infarct rather than extension of acute infarct. Dilation left lateral ventricle and wallerian degeneration secondary to the remote infarct. Remote small right centrum semiovale infarct. Small vessel disease type changes. Taking into account distortion by remote infarct, no findings of Wernicke's encephalopathy. No intracranial mass lesion noted on this unenhanced exam. There may be mild degenerative changes C3-4 level. Cervical medullary junction, pituitary region, pineal region and orbital  structures unremarkable. Mild polypoid opacification inferior aspect right maxillary sinus. MRA HEAD FINDINGS Occluded left internal carotid artery. No reconstitution of flow of the left carotid terminus or left middle cerebral artery. Small portions of the A2 segment of the left anterior cerebral artery are visualized with moderate to marked tandem stenosis. Mild slightly moderate narrowing cavernous segment right internal carotid artery. Fetal type contribution to the right posterior cerebral artery. Moderate to marked narrowing M1 segment right middle cerebral artery and the right middle cerebral artery bifurcation poor delineation right middle cerebral artery distal branches. Marked narrowing proximal A2 segment right anterior cerebral artery. Left vertebral artery is dominant. No significant stenosis of the distal left vertebral artery. Mild narrowing distal right vertebral artery. Nonvisualized left posterior inferior cerebellar artery. Moderate to marked narrowing portions of the right posterior inferior cerebellar artery. Ectatic basilar artery with mild narrowing without high-grade stenosis. Poor delineation right superior cerebellar artery. Moderate tandem stenosis posterior cerebral artery bilaterally more notable on the left. No aneurysm noted. IMPRESSION: MRI HEAD No acute infarct. Large remote left hemispheric infarct with encephalomalacia. Remote small right centrum semiovale infarct. Small vessel disease type changes. Taking into account distortion by remote infarct, no findings of Wernicke's encephalopathy. MRA HEAD Occluded left internal carotid artery. No reconstitution of flow of the left carotid terminus or left middle cerebral artery. Small portions of the A2 segment of the left anterior cerebral artery are visualized with moderate to marked tandem stenosis. Mild to slightly moderate narrowing cavernous segment right internal carotid artery. Moderate to marked narrowing M1 segment right middle  cerebral artery and the right middle cerebral artery bifurcation. Poor delineation right middle cerebral artery distal branches. Marked narrowing proximal A2 segment right anterior cerebral artery. Left vertebral artery is dominant. No significant stenosis of the distal left vertebral artery. Mild narrowing distal right vertebral artery. Nonvisualized left posterior inferior cerebellar artery. Moderate to marked narrowing portions of the right posterior inferior cerebellar artery. Ectatic basilar artery with mild narrowing without high-grade stenosis. Poor delineation right superior cerebellar artery. Moderate tandem stenosis posterior cerebral artery bilaterally more notable on the left. Electronically Signed   By: Lacy Duverney M.D.   On: 09/04/2015 16:39   US Carotid Bilateral  09/03/2015  EXAM: BILATERAL CAROTID DUPLEX ULTRASOUND TECHNIQUE: Wallace Cullens scale imaging, color Doppler and duplex ultrasound were performed of bilateral carotid and vertebral arteries in the neck. COMPARISON:  None. FINDINGS: Criteria: Quantification of carotid stenosis is based on velocity parameters that correlate the residual internal carotid  diameter with NASCET-based stenosis levels, using the diameter of the distal internal carotid lumen as the denominator for stenosis measurement. The following velocity measurements were obtained: RIGHT ICA:   cm/sec CCA:   cm/sec SYSTOLIC ICA/CCA RATIO: DIASTOLIC ICA/CCA RATIO: ECA:   cm/sec LEFT ICA:   cm/sec CCA:   cm/sec SYSTOLIC ICA/CCA RATIO: DIASTOLIC ICA/CCA RATIO: ECA:   cm/sec RIGHT CAROTID ARTERY: RIGHT VERTEBRAL ARTERY: LEFT CAROTID ARTERY: LEFT VERTEBRAL ARTERY: Electronically Signed   By: Signa Kell M.D.   On: 09/03/2015 15:44   Mr Maxine Glenn Head/brain Wo Cm  09/04/2015  CLINICAL DATA:  49 year old diabetic hypertensive male with hyperlipidemia, polysubstance abuse and pancreatitis presenting with unresponsiveness 09/02/2015 at which time high alcohol level was noted. Subsequent  encounter. EXAM: MRI HEAD WITHOUT CONTRAST MRA HEAD WITHOUT CONTRAST TECHNIQUE: Multiplanar, multiecho pulse sequences of the brain and surrounding structures were obtained without intravenous contrast. Angiographic images of the head were obtained using MRA technique without contrast. COMPARISON:  09/02/2015 head CT. FINDINGS: MRI HEAD FINDINGS Large left hemispheric infarct with encephalomalacia. Regions of restricted motion within portions of the left hemisphere infarct are felt most consistent with result of blood breakdown products/laminar necrosis from the remote infarct rather than extension of acute infarct. Dilation left lateral ventricle and wallerian degeneration secondary to the remote infarct. Remote small right centrum semiovale infarct. Small vessel disease type changes. Taking into account distortion by remote infarct, no findings of Wernicke's encephalopathy. No intracranial mass lesion noted on this unenhanced exam. There may be mild degenerative changes C3-4 level. Cervical medullary junction, pituitary region, pineal region and orbital structures unremarkable. Mild polypoid opacification inferior aspect right maxillary sinus. MRA HEAD FINDINGS Occluded left internal carotid artery. No reconstitution of flow of the left carotid terminus or left middle cerebral artery. Small portions of the A2 segment of the left anterior cerebral artery are visualized with moderate to marked tandem stenosis. Mild slightly moderate narrowing cavernous segment right internal carotid artery. Fetal type contribution to the right posterior cerebral artery. Moderate to marked narrowing M1 segment right middle cerebral artery and the right middle cerebral artery bifurcation poor delineation right middle cerebral artery distal branches. Marked narrowing proximal A2 segment right anterior cerebral artery. Left vertebral artery is dominant. No significant stenosis of the distal left vertebral artery. Mild narrowing distal  right vertebral artery. Nonvisualized left posterior inferior cerebellar artery. Moderate to marked narrowing portions of the right posterior inferior cerebellar artery. Ectatic basilar artery with mild narrowing without high-grade stenosis. Poor delineation right superior cerebellar artery. Moderate tandem stenosis posterior cerebral artery bilaterally more notable on the left. No aneurysm noted. IMPRESSION: MRI HEAD No acute infarct. Large remote left hemispheric infarct with encephalomalacia. Remote small right centrum semiovale infarct. Small vessel disease type changes. Taking into account distortion by remote infarct, no findings of Wernicke's encephalopathy. MRA HEAD Occluded left internal carotid artery. No reconstitution of flow of the left carotid terminus or left middle cerebral artery. Small portions of the A2 segment of the left anterior cerebral artery are visualized with moderate to marked tandem stenosis. Mild to slightly moderate narrowing cavernous segment right internal carotid artery. Moderate to marked narrowing M1 segment right middle cerebral artery and the right middle cerebral artery bifurcation. Poor delineation right middle cerebral artery distal branches. Marked narrowing proximal A2 segment right anterior cerebral artery. Left vertebral artery is dominant. No significant stenosis of the distal left vertebral artery. Mild narrowing distal right vertebral artery. Nonvisualized left posterior inferior cerebellar artery. Moderate to marked narrowing portions  of the right posterior inferior cerebellar artery. Ectatic basilar artery with mild narrowing without high-grade stenosis. Poor delineation right superior cerebellar artery. Moderate tandem stenosis posterior cerebral artery bilaterally more notable on the left. Electronically Signed   By: Lacy Duverney M.D.   On: 09/04/2015 16:39      Management plans discussed with the patient and brother  and he is in agreement. Stable for  discharge   Patient should follow up with PCP in1 week NEURO in 3 months2  CODE STATUS:     Code Status Orders        Start     Ordered   09/02/15 2257  Full code   Continuous     09/02/15 2256      TOTAL TIME TAKING CARE OF THIS PATIENT: 35 minutes.    Note: This dictation was prepared with Dragon dictation along with smaller phrase technology. Any transcriptional errors that result from this process are unintentional.  Hilery Wintle M.D on 09/05/2015 at 12:05 PM  Between 7am to 6pm - Pager - [phone] After 6pm go to [email or web address] - password [email or web address] [facility]  [facility]  Office  [phone]  CC: Primary care physician; No primary care provider on file.

## 2015-09-05 NOTE — Clinical Social Work Note (Signed)
CSW has noted PT recommendations. ChiropodistAssistant Director of CSW is reviewing case to determine if an LOG will be approved for patient to go to a skilled facility at this time as patient has no payor source.  York SpanielMonica Gissel Keilman MSW,LCSW [phone]

## 2015-09-05 NOTE — Care Management (Signed)
Spoke with patient's brother [name].  Discussed that CSW has attempted to call him and needed to speak with family member because patient is for discharge home and would need transportation home  Relayed that CSW had spoken with a family care home owner that would be willing to work with patient on payment.  This can be pursued after discharge if family needs to discuss or can arrange today. Asked unit secretary to make patient a follow up [facility] rather than leave it up to the patient to make the appointment.  Family will need to provide transportation if patient declines family care home.  Unable to set up home health as at present there is no PCP.

## 2015-09-05 NOTE — Clinical Social Work Note (Signed)
ChiropodistAssistant Director of CSW has reviewed record and has declined an LOG at this time.  York SpanielMonica Randie Bloodgood MSW,LCSW [phone]

## 2015-09-05 NOTE — Care Management (Signed)
Was finally able to make telephone contact with patient's step father at [phone].  Spoke with patient's stepfather and informed him patient being discharged home.   Discussed that made multiple attempts to get message to him and wife about having a family care home that would have taken patient today if this placement was wanted but due to the fact unable to contact during business hours, patient would have to discharge home and family/patient make contact from home.  Provided parents with phone number for [facility] and the facility listing/ contact information left in patient room.

## 2016-08-03 ENCOUNTER — Emergency Department: Payer: Medicare Other

## 2016-08-03 ENCOUNTER — Encounter: Payer: Self-pay | Admitting: Emergency Medicine

## 2016-08-03 ENCOUNTER — Emergency Department
Admission: EM | Admit: 2016-08-03 | Discharge: 2016-08-03 | Payer: Medicare Other | Attending: Emergency Medicine | Admitting: Emergency Medicine

## 2016-08-03 DIAGNOSIS — Y929 Unspecified place or not applicable: Secondary | ICD-10-CM | POA: Insufficient documentation

## 2016-08-03 DIAGNOSIS — Z79899 Other long term (current) drug therapy: Secondary | ICD-10-CM | POA: Insufficient documentation

## 2016-08-03 DIAGNOSIS — I2510 Atherosclerotic heart disease of native coronary artery without angina pectoris: Secondary | ICD-10-CM | POA: Diagnosis not present

## 2016-08-03 DIAGNOSIS — R4182 Altered mental status, unspecified: Secondary | ICD-10-CM | POA: Diagnosis present

## 2016-08-03 DIAGNOSIS — E119 Type 2 diabetes mellitus without complications: Secondary | ICD-10-CM | POA: Insufficient documentation

## 2016-08-03 DIAGNOSIS — Z87891 Personal history of nicotine dependence: Secondary | ICD-10-CM | POA: Diagnosis not present

## 2016-08-03 DIAGNOSIS — X58XXXA Exposure to other specified factors, initial encounter: Secondary | ICD-10-CM | POA: Diagnosis not present

## 2016-08-03 DIAGNOSIS — S065X0A Traumatic subdural hemorrhage without loss of consciousness, initial encounter: Secondary | ICD-10-CM | POA: Diagnosis not present

## 2016-08-03 DIAGNOSIS — Y939 Activity, unspecified: Secondary | ICD-10-CM | POA: Insufficient documentation

## 2016-08-03 DIAGNOSIS — Z794 Long term (current) use of insulin: Secondary | ICD-10-CM | POA: Diagnosis not present

## 2016-08-03 DIAGNOSIS — I10 Essential (primary) hypertension: Secondary | ICD-10-CM | POA: Insufficient documentation

## 2016-08-03 DIAGNOSIS — S065X9A Traumatic subdural hemorrhage with loss of consciousness of unspecified duration, initial encounter: Secondary | ICD-10-CM

## 2016-08-03 DIAGNOSIS — Y999 Unspecified external cause status: Secondary | ICD-10-CM | POA: Insufficient documentation

## 2016-08-03 DIAGNOSIS — S065XAA Traumatic subdural hemorrhage with loss of consciousness status unknown, initial encounter: Secondary | ICD-10-CM

## 2016-08-03 LAB — BLOOD GAS, ARTERIAL
ACID-BASE DEFICIT: 2 mmol/L (ref 0.0–2.0)
BICARBONATE: 23.1 mmol/L (ref 20.0–28.0)
FIO2: 0.7
O2 Saturation: 89.8 %
PCO2 ART: 40 mmHg (ref 32.0–48.0)
PH ART: 7.37 (ref 7.350–7.450)
Patient temperature: 37
pO2, Arterial: 60 mmHg — ABNORMAL LOW (ref 83.0–108.0)

## 2016-08-03 LAB — URINALYSIS COMPLETE WITH MICROSCOPIC (ARMC ONLY)
Bilirubin Urine: NEGATIVE
GLUCOSE, UA: 50 mg/dL — AB
NITRITE: NEGATIVE
Protein, ur: 30 mg/dL — AB
SPECIFIC GRAVITY, URINE: 1.027 (ref 1.005–1.030)
pH: 5 (ref 5.0–8.0)

## 2016-08-03 LAB — CBC WITH DIFFERENTIAL/PLATELET
BASOS ABS: 0.2 10*3/uL — AB (ref 0–0.1)
Basophils Relative: 1 %
EOS PCT: 1 %
Eosinophils Absolute: 0.2 10*3/uL (ref 0–0.7)
HCT: 49.4 % (ref 40.0–52.0)
Hemoglobin: 16.4 g/dL (ref 13.0–18.0)
LYMPHS ABS: 2 10*3/uL (ref 1.0–3.6)
Lymphocytes Relative: 13 %
MCH: 30.5 pg (ref 26.0–34.0)
MCHC: 33.2 g/dL (ref 32.0–36.0)
MCV: 92 fL (ref 80.0–100.0)
MONO ABS: 2.2 10*3/uL — AB (ref 0.2–1.0)
Monocytes Relative: 14 %
Neutro Abs: 10.9 10*3/uL — ABNORMAL HIGH (ref 1.4–6.5)
Neutrophils Relative %: 71 %
PLATELETS: 479 10*3/uL — AB (ref 150–440)
RBC: 5.37 MIL/uL (ref 4.40–5.90)
RDW: 13 % (ref 11.5–14.5)
WBC: 15.5 10*3/uL — AB (ref 3.8–10.6)

## 2016-08-03 LAB — COMPREHENSIVE METABOLIC PANEL
ALT: 25 U/L (ref 17–63)
AST: 23 U/L (ref 15–41)
Albumin: 4 g/dL (ref 3.5–5.0)
Alkaline Phosphatase: 76 U/L (ref 38–126)
Anion gap: 10 (ref 5–15)
BILIRUBIN TOTAL: 0.7 mg/dL (ref 0.3–1.2)
BUN: 38 mg/dL — ABNORMAL HIGH (ref 6–20)
CO2: 22 mmol/L (ref 22–32)
CREATININE: 1.08 mg/dL (ref 0.61–1.24)
Calcium: 9.4 mg/dL (ref 8.9–10.3)
Chloride: 109 mmol/L (ref 101–111)
GFR calc Af Amer: >60 mL/min (ref >60)
Glucose, Bld: 182 mg/dL — ABNORMAL HIGH (ref 65–99)
Potassium: 4.5 mmol/L (ref 3.5–5.1)
Sodium: 141 mmol/L (ref 135–145)
TOTAL PROTEIN: 8.2 g/dL — AB (ref 6.5–8.1)

## 2016-08-03 LAB — LACTIC ACID, PLASMA: Lactic Acid, Venous: 2.3 mmol/L (ref 0.5–1.9)

## 2016-08-03 MED ORDER — PIPERACILLIN-TAZOBACTAM 3.375 G IVPB
INTRAVENOUS | Status: AC
  Administered 2016-08-03: 3.375 g via INTRAVENOUS
  Filled 2016-08-03: qty 50

## 2016-08-03 MED ORDER — PIPERACILLIN-TAZOBACTAM 3.375 G IVPB 30 MIN
3.3750 g | Freq: Once | INTRAVENOUS | Status: AC
  Administered 2016-08-03: 3.375 g via INTRAVENOUS

## 2016-08-03 MED ORDER — ACETAMINOPHEN 650 MG RE SUPP
650.0000 mg | Freq: Once | RECTAL | Status: AC
  Administered 2016-08-03: 650 mg via RECTAL
  Filled 2016-08-03: qty 1

## 2016-08-03 MED ORDER — PROPOFOL 1000 MG/100ML IV EMUL
INTRAVENOUS | Status: AC
  Filled 2016-08-03: qty 100

## 2016-08-03 MED ORDER — ROCURONIUM BROMIDE 50 MG/5ML IV SOLN
1.0000 mg/kg | Freq: Once | INTRAVENOUS | Status: AC
  Administered 2016-08-03: 70.9 mg via INTRAVENOUS
  Filled 2016-08-03: qty 7.09

## 2016-08-03 MED ORDER — PROPOFOL 10 MG/ML IV BOLUS
1.0000 mg/kg | Freq: Once | INTRAVENOUS | Status: AC
  Administered 2016-08-03: 70.9 mg via INTRAVENOUS

## 2016-08-03 MED ORDER — SODIUM CHLORIDE 0.9 % IV BOLUS (SEPSIS)
1000.0000 mL | Freq: Once | INTRAVENOUS | Status: AC
  Administered 2016-08-03: 1000 mL via INTRAVENOUS

## 2016-08-03 MED ORDER — PROPOFOL 1000 MG/100ML IV EMUL
INTRAVENOUS | Status: AC
  Administered 2016-08-03: 5 ug/kg/min via INTRAVENOUS
  Filled 2016-08-03: qty 100

## 2016-08-03 MED ORDER — MIDAZOLAM HCL 2 MG/2ML IJ SOLN
5.0000 mg | Freq: Once | INTRAMUSCULAR | Status: AC
  Administered 2016-08-03: 5 mg via INTRAVENOUS

## 2016-08-03 MED ORDER — PROPOFOL 1000 MG/100ML IV EMUL
5.0000 ug/kg/min | Freq: Once | INTRAVENOUS | Status: AC
  Administered 2016-08-03: 5 ug/kg/min via INTRAVENOUS

## 2016-08-03 NOTE — ED Triage Notes (Addendum)
Patient arrived to ED via [facility] from [facility] for reports of Altered Mental status and Chenye stokes respirations. On arrival to ED patient is non-verbal and tachy in the 150's. Patient has hx/o TBI.

## 2016-08-03 NOTE — ED Notes (Signed)
[facility] and [place], [place]

## 2016-08-03 NOTE — ED Notes (Signed)
Dr. Huel CoteQuigley at bedside preparing to intubation. Patient observed having focal seizure and cheyne-stokes respirations

## 2016-08-03 NOTE — ED Notes (Addendum)
Patient still experiencing focal seizure like acitivity, Dr. Huel CoteQuigley informed. Verbal order given by Dr. Huel CoteQuigley to increase Propofol drip to 25 mL/min

## 2016-08-03 NOTE — ED Notes (Addendum)
Patient family arrived at bedside. Per patient family, patient fell 2 weeks ago and was [facility], patient was found to have skull fracture and bleed at that time. Patient was transferred to [facility] on 07/25/16. Per family patient was verbal until last Saturday. Family went to visit patient yesterday and he was able to move both arms and to shake his head yes or no. When family went to visit patient today he was not able to move extremities or nod head.

## 2016-08-03 NOTE — ED Provider Notes (Signed)
Time Seen: Approximately 1927  I have reviewed the triage notes  Chief Complaint: Code Sepsis   History of Present Illness: Rodney [name]. is a 50 y.o. male who presents via EMS from [facility] with report of increased altered mental status with irregular respirations. 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 Reviewed the CTs at [facility] where he was admitted showed a small subdural hematoma and previous encephalomalacia from his cerebrovascular accident. Patient has right-sided hemiparesis. Limited history via EMS. Patient himself is not able to offer any history or review of systems is nonverbal. Patient appears to have a previous tracheostomy site. Rectal temperature here is 99.7 Past Medical History:  Diagnosis Date  . Coronary artery disease 2013   S/P STENT PLACEMENT/CARDIAC CATHETERIZAITION  . Diabetes mellitus    Type II  . GERD (gastroesophageal reflux disease)   . History of depression   . History of pancreatitis   . Hyperlipidemia   . Hypertension   . OSA (obstructive sleep apnea)   . Polysubstance abuse     Patient Active Problem List   Diagnosis Date Noted  . Unresponsive episode 09/02/2015  . Alcohol intoxication (HCC) 09/02/2015  . Stroke (HCC) 09/02/2015  . Type 2 diabetes mellitus (HCC) 09/02/2015  . HTN (hypertension) 09/02/2015  . HLD (hyperlipidemia) 09/02/2015  . GERD (gastroesophageal reflux disease) 09/02/2015    Past Surgical History:  Procedure Laterality Date  . CARDIAC CATHETERIZATION  2013   S/P STENT PLACEMENT  . CORONARY ARTERY BYPASS GRAFT      Past Surgical History:  Procedure Laterality Date  . CARDIAC CATHETERIZATION  2013   S/P STENT PLACEMENT  . CORONARY ARTERY BYPASS GRAFT      Current Outpatient Rx  . Order #: [id number] Class: Historical Med  . Order #: [id number] Class: Historical Med  . Order #: [id number] Class: Historical Med  . Order  #: [id number] Class: Historical Med  . Order #: [id number] Class: Historical Med  . Order #: [id number] Class: Historical Med  . Order #: [id number] Class: Historical Med  . Order #: [id number] Class: Historical Med  . Order #: [id number] Class: Historical Med  . Order #: [id number] Class: Historical Med  . Order #: [id number] Class: Historical Med  . Order #: [id number] Class: Historical Med  . Order #: [id number] Class: Normal  . Order #: [id number] Class: Normal  . Order #: [id number] Class: Normal  . Order #: [id number] Class: Normal    Allergies:  Insulin aspart; Insulin isophane; and Zinc  Family History: No family history on file.  Social History: Social History  Substance Use Topics  . Smoking status: Former Games developer  . Smokeless tobacco: Never Used  . Alcohol use 0.0 oz/week     Review of Systems:   10 point review of systems was performed and was otherwise negative: Review of systems is again very limited secondary to the patient's previous total brain injury. There is no family available to interview at the time of his initial evaluation Constitutional: No Known fever Eyes: No visual disturbances ENT: No sore throat, ear pain Cardiac: No chest pain Respiratory: No shortness of breath, wheezing, or stridor Abdomen: No abdominal pain, no vomiting, No diarrhea Endocrine: No weight loss, No night sweats Extremities: No peripheral edema, cyanosis Skin: No rashes, easy bruising Neurologic: No focal weakness, trouble with speech or swollowing Urologic: No dysuria, Hematuria, or urinary frequency   Physical Exam:  ED Triage Vitals  Enc Vitals Group     BP 08/03/16 1858 Marland Kitchen)  130/98     Pulse Rate 08/03/16 1858 (!) 144     Resp 08/03/16 1858 (!) 24     Temp 08/03/16 1858 99.7 F (37.6 C)     Temp src --      SpO2 08/03/16 1858 95 %     Weight 08/03/16 1901 156 lb 3.2 oz (70.9 kg)     Height 08/03/16 1901 5\' 11"  (1.803 m)     Head Circumference --      Peak Flow --      Pain Score --       Pain Loc --      Pain Edu? --      Excl. in GC? --     General: Awake , Alert , Patient's nonverbal he periodically will look to the left and to the right. Occasional spasm of his right upper extremity (questionable seizure activity). He is currently on a 2 L nasal cannula. Head: Normal cephalic , atraumatic Eyes: Right pupil is sluggish compared to the left reactive to 4-2 mm Nose/Throat: No nasal drainage, patent upper airway without erythema or exudate.  Neck: Supple, Full range of motion, No anterior adenopathy or palpable thyroid masses Lungs: Patient does have Cheyne-Stokes respirations without wheezing or rhonchi  Heart: Tachycardic, regular rhythm without murmurs , gallops , or rubs Abdomen: Soft, non tender without rebound, guarding , or rigidity; bowel sounds positive and symmetric in all 4 quadrants. No organomegaly .        Extremities: 2 plus symmetric pulses. No edema, clubbing or cyanosis Neurologic: normal ambulation, Motor symmetric without deficits, sensory intact Skin: warm, dry, no rashes Chest shows previous sternal scar  Labs:   All laboratory work was reviewed including any pertinent negatives or positives listed below:  Labs Reviewed  COMPREHENSIVE METABOLIC PANEL - Abnormal; Notable for the following:       Result Value   Glucose, Bld 182 (*)    BUN 38 (*)    Total Protein 8.2 (*)    All other components within normal limits  CBC WITH DIFFERENTIAL/PLATELET - Abnormal; Notable for the following:    WBC 15.5 (*)    Platelets 479 (*)    Neutro Abs 10.9 (*)    Monocytes Absolute 2.2 (*)    Basophils Absolute 0.2 (*)    All other components within normal limits  LACTIC ACID, PLASMA - Abnormal; Notable for the following:    Lactic Acid, Venous 2.3 (*)    All other components within normal limits  URINALYSIS COMPLETEWITH MICROSCOPIC ([facility] ONLY) - Abnormal; Notable for the following:    Color, Urine AMBER (*)    APPearance CLEAR (*)    Glucose, UA 50  (*)    Ketones, ur 1+ (*)    Hgb urine dipstick 1+ (*)    Protein, ur 30 (*)    Leukocytes, UA TRACE (*)    Bacteria, UA RARE (*)    Squamous Epithelial / LPF 0-5 (*)    All other components within normal limits  BLOOD GAS, ARTERIAL - Abnormal; Notable for the following:    pO2, Arterial 60 (*)    All other components within normal limits  CULTURE, BLOOD (ROUTINE X 2)  CULTURE, BLOOD (ROUTINE X 2)  URINE CULTURE  LACTIC ACID, PLASMA    EKG: * ED ECG REPORT I, Jennye Moccasin, the attending physician, personally viewed and interpreted this ECG.  Date: 08/03/2016 EKG Time: 1856 Rate: *144 Rhythm: Sinus tachycardia QRS Axis: normal Intervals: normal ST/T  Wave abnormalities: Nonspecific T wave abnormality Conduction Disturbances: none Narrative Interpretation: unremarkable   Radiology:  "Ct Head Wo Contrast  Result Date: 08/03/2016 CLINICAL DATA:  Altered mental status. EXAM: CT HEAD WITHOUT CONTRAST TECHNIQUE: Contiguous axial images were obtained from the base of the skull through the vertex without intravenous contrast. COMPARISON:  CT scan of September 02, 2015. FINDINGS: Brain: Diffuse cortical atrophy is noted. Large left subdural hematoma is noted which does not result in significant midline shift. It has a maximum thickness of approximately 2 cm. Sulcal effacement is seen in the right frontal area concerning for acute infarction. Old infarction of left MCA territory is again noted. Some degree of subarachnoid hemorrhage appears to be present posteriorly in the area of left MCA encephalomalacia. Vascular: High density material seen in distal portion of right MCA branch concerning for thrombus. Skull: Nondisplaced fracture is seen extending from the vertex into the right parietal skull. Probable nondisplaced right zygomatic arch fracture. Sinuses/Orbits: Visualized paranasal sinuses are unremarkable. Other: None. IMPRESSION: Large left subdural hematoma is noted with maximum  thickness of 2 cm. Some degree of subarachnoid hemorrhage is noted posteriorly in the area of left MCA encephalomalacia. Findings consistent with large acute infarction in the right posterior frontal area with probable thrombus seen in distal portion of right MCA branch. Nondisplaced fracture is seen extending from the vertex of the skull into the right parietal skull. Probable nondisplaced right zygomatic arch fracture is noted as well. Critical Value/emergent results were called by telephone at the time of interpretation on 08/03/2016 at 8:53 pm to Dr. Lacretia Nicks , who verbally acknowledged these results. Electronically Signed   By: Lupita Raider, M.D.   On: 08/03/2016 20:54   Dg Chest Portable 1 View  Result Date: 08/03/2016 CLINICAL DATA:  Post intubation EXAM: PORTABLE CHEST 1 VIEW COMPARISON:  08/03/2016 FINDINGS: Interval placement of an endotracheal tube with tip measuring 3.7 cm above the carina. Postoperative changes in the mediastinum. Elevation of the right hemidiaphragm. Asymmetrical decreased size of the right hemi thorax in respect the left is likely due to a combination of patient rotation, atelectasis on the right, and compensatory hyperinflation of the left. No focal consolidation. No blunting of costophrenic angles. No pneumothorax. Mediastinal contours appear intact. IMPRESSION: Endotracheal tube placed with tip measuring 3.7 cm above the carina. Elevation of the right hemi diaphragm with probable atelectasis in the right lung base appear Electronically Signed   By: Burman Nieves M.D.   On: 08/03/2016 21:44   Dg Chest Port 1 View  Result Date: 08/03/2016 CLINICAL DATA:  Altered mental status. EXAM: PORTABLE CHEST 1 VIEW COMPARISON:  Radiograph of September 02, 2015. FINDINGS: The heart size and mediastinal contours are within normal limits. Both lungs are clear. No pneumothorax or pleural effusion is noted. Sternotomy wires are noted. The visualized skeletal structures are  unremarkable. IMPRESSION: No acute cardiopulmonary abnormality seen. Electronically Signed   By: Lupita Raider, M.D.   On: 08/03/2016 19:43  "  I personally reviewed the radiologic studies   Procedures:   Patient had orotracheal intubation for airway stabilization The patient was given propofol bolus and then was eventually started on a propofol drip for sedation Patient received 1 mg/kg Rocuronium Patient was intubated with a 7.5 endotracheal tube with a MacIntosh blade. Good landmarks were cited and the patient tolerated the intubation well. Positive CO2 change and the patient was hooked up to the ventilator with breath sounds equal bilaterally. Portable chest x-ray shows adequate tube  placement Orogastric tube was established by the nursing staff and the patient already had a Foley catheter   Critical Care:  CRITICAL CARE Performed by: Jennye MoccasinBrian S Sire Poet   Total critical care time: 53 minutes  Critical care time was exclusive of separately billable procedures and treating other patients.  Critical care was necessary to treat or prevent imminent or life-threatening deterioration.  Critical care was time spent personally by me on the following activities: development of treatment plan with patient and/or surrogate as well as nursing, discussions with consultants, evaluation of patient's response to treatment, examination of patient, obtaining history from patient or surrogate, ordering and performing treatments and interventions, ordering and review of laboratory studies, ordering and review of radiographic studies, pulse oximetry and re-evaluation of patient's condition.  Initial workup for the patient with multiple medical problems with a new onset subdural hematoma in association with chronic encephalopathy and previous cerebrovascular accidents Patient was initiated on septic workup   ED Course: * Patient arrives with an elevated heart rate and rectal temperature of 99.7.  Patient was initiated on a septic workup with blood cultures, urine and urine culture, etc. Patient was given IV fluid resuscitation for what appeared to be sepsis associated with tachycardia. The patient's mental status is difficult to ascertain her referral from the [facility] on whether there is a change in his mental status, etc. I felt given his respiratory pattern and the sluggishness of his right pupil head CT would be necessary. Head CT shows what appears to be a fresh left-sided subdural hematoma. Patient has received IV antibiotics after being pan cultured which was Zosyn and it was determined that he would require neurosurgical assessment. [facility] and I spoke to their neurosurgeon on call and they've agreed to accept the patient to the neuro intensive care unit. The patient was continued on his propofol drip and was given a bolus of versed said and will be given verbal orders [facility]. Patient's current condition was reviewed with his family who [facility] and wear the neuro intensive care unit is, etc. I advised them that further management of his subdural is really up to the individual neurosurgeon and was more important to get him to the intensive care unit there were they have the appropriate equipment, staff etc. Clinical Course     Assessment: * Acute subdural hematoma Possible acute cerebrovascular accident Possible sepsis   Final Clinical Impression:  Final diagnoses:  Subdural hematoma (HCC)     Plan[facility]  [facility]           Jennye MoccasinBrian S Jeniyah Menor, MD 08/03/16 2253

## 2016-08-05 LAB — URINE CULTURE: CULTURE: NO GROWTH

## 2016-08-08 LAB — CULTURE, BLOOD (ROUTINE X 2)
Culture: NO GROWTH
Culture: NO GROWTH

## 2016-12-02 IMAGING — CR DG PORTABLE PELVIS
1 series · 1 of 1 positions shown · non-contrast
Comparison: None.

CLINICAL DATA: No history available.  Unresponsive?

EXAM:
PORTABLE PELVIS 1-2 VIEWS

[ap]
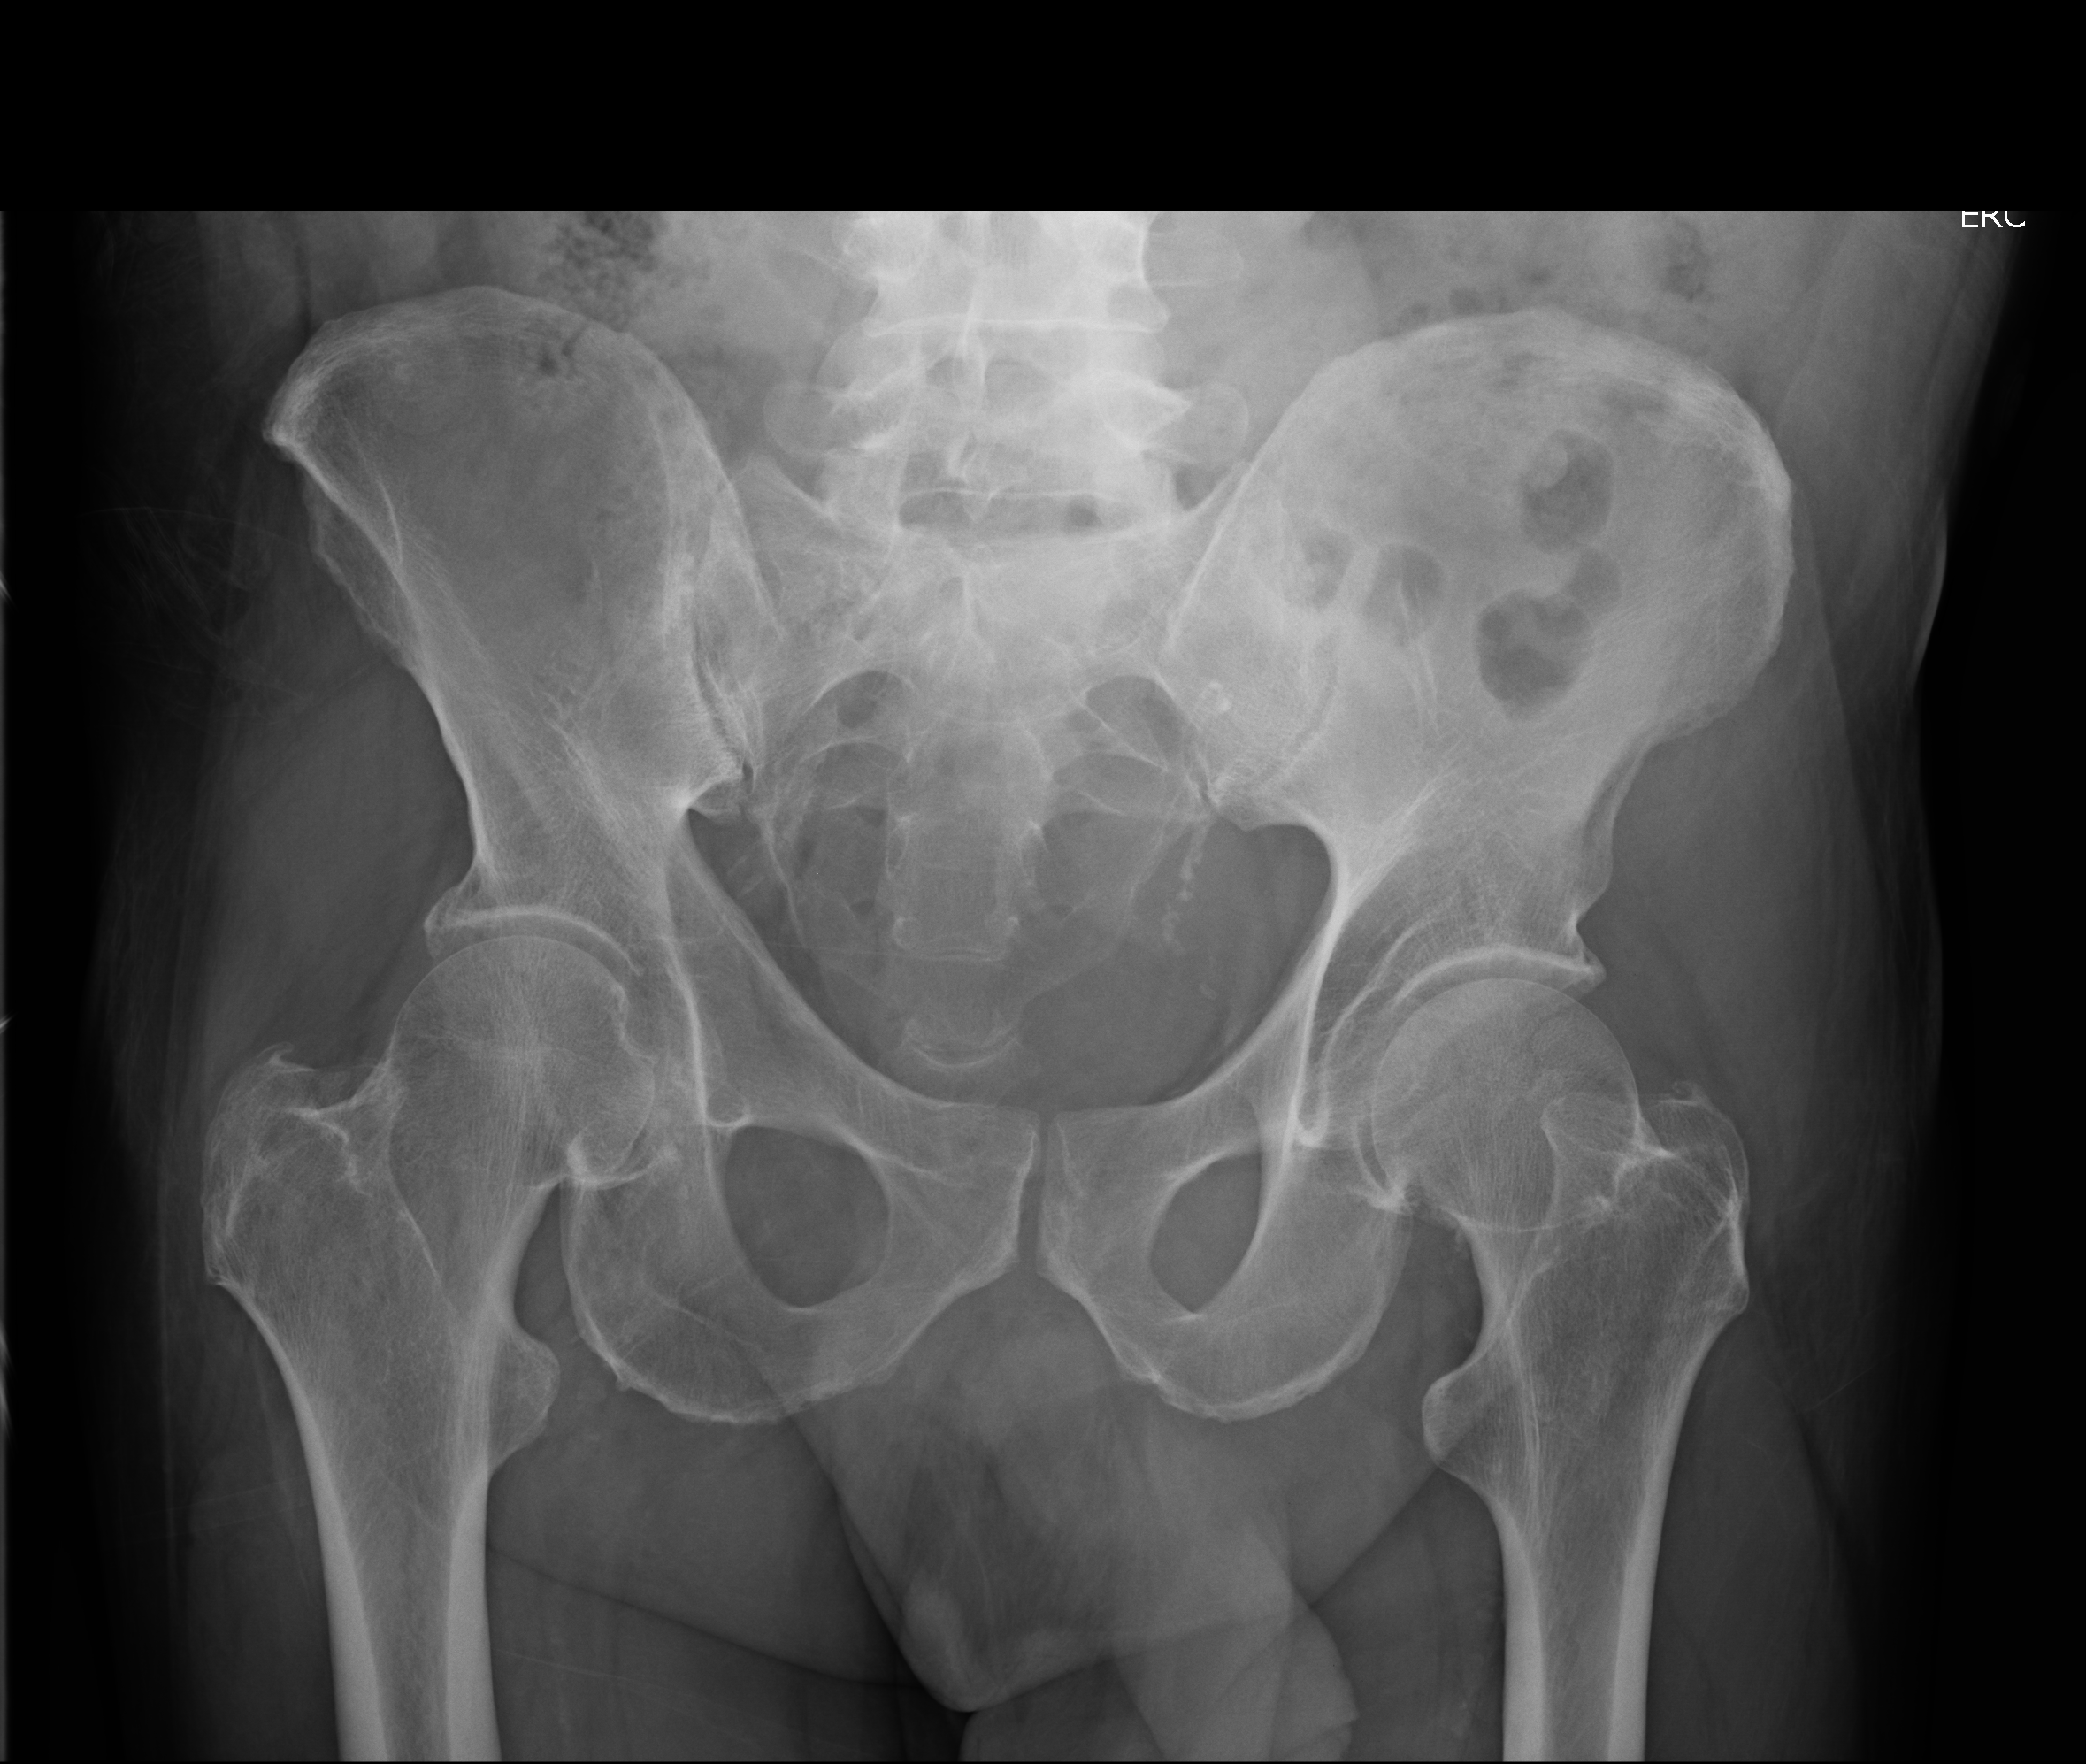

[1 of 1 positions shown; findings below may reference images not displayed]

FINDINGS: Single view of the pelvis is provided. Osseous alignment appears
normal. No fracture line or displaced fracture fragment identified,
although portions of the left femoral neck and sacrum are difficult
to evaluate due to slightly oblique patient positioning.

No acute- appearing cortical irregularity or osseous lesion. No
significant degenerative change. Soft tissues about the pelvis are
unremarkable.
IMPRESSION: Unremarkable plain film examination of the pelvis, with mild study
limitations detailed above. If any possibility of a left femoral
neck fracture, would recommend repeat plain film examination with
less oblique patient positioning.

## 2016-12-03 IMAGING — CR DG ABDOMEN 1V
1 series · 1 of 1 positions shown · non-contrast
Comparison: None.

CLINICAL DATA: Evaluate for metal implant.  Clearance for MRI.

EXAM:
ABDOMEN - 1 VIEW

[t abdomen supine]
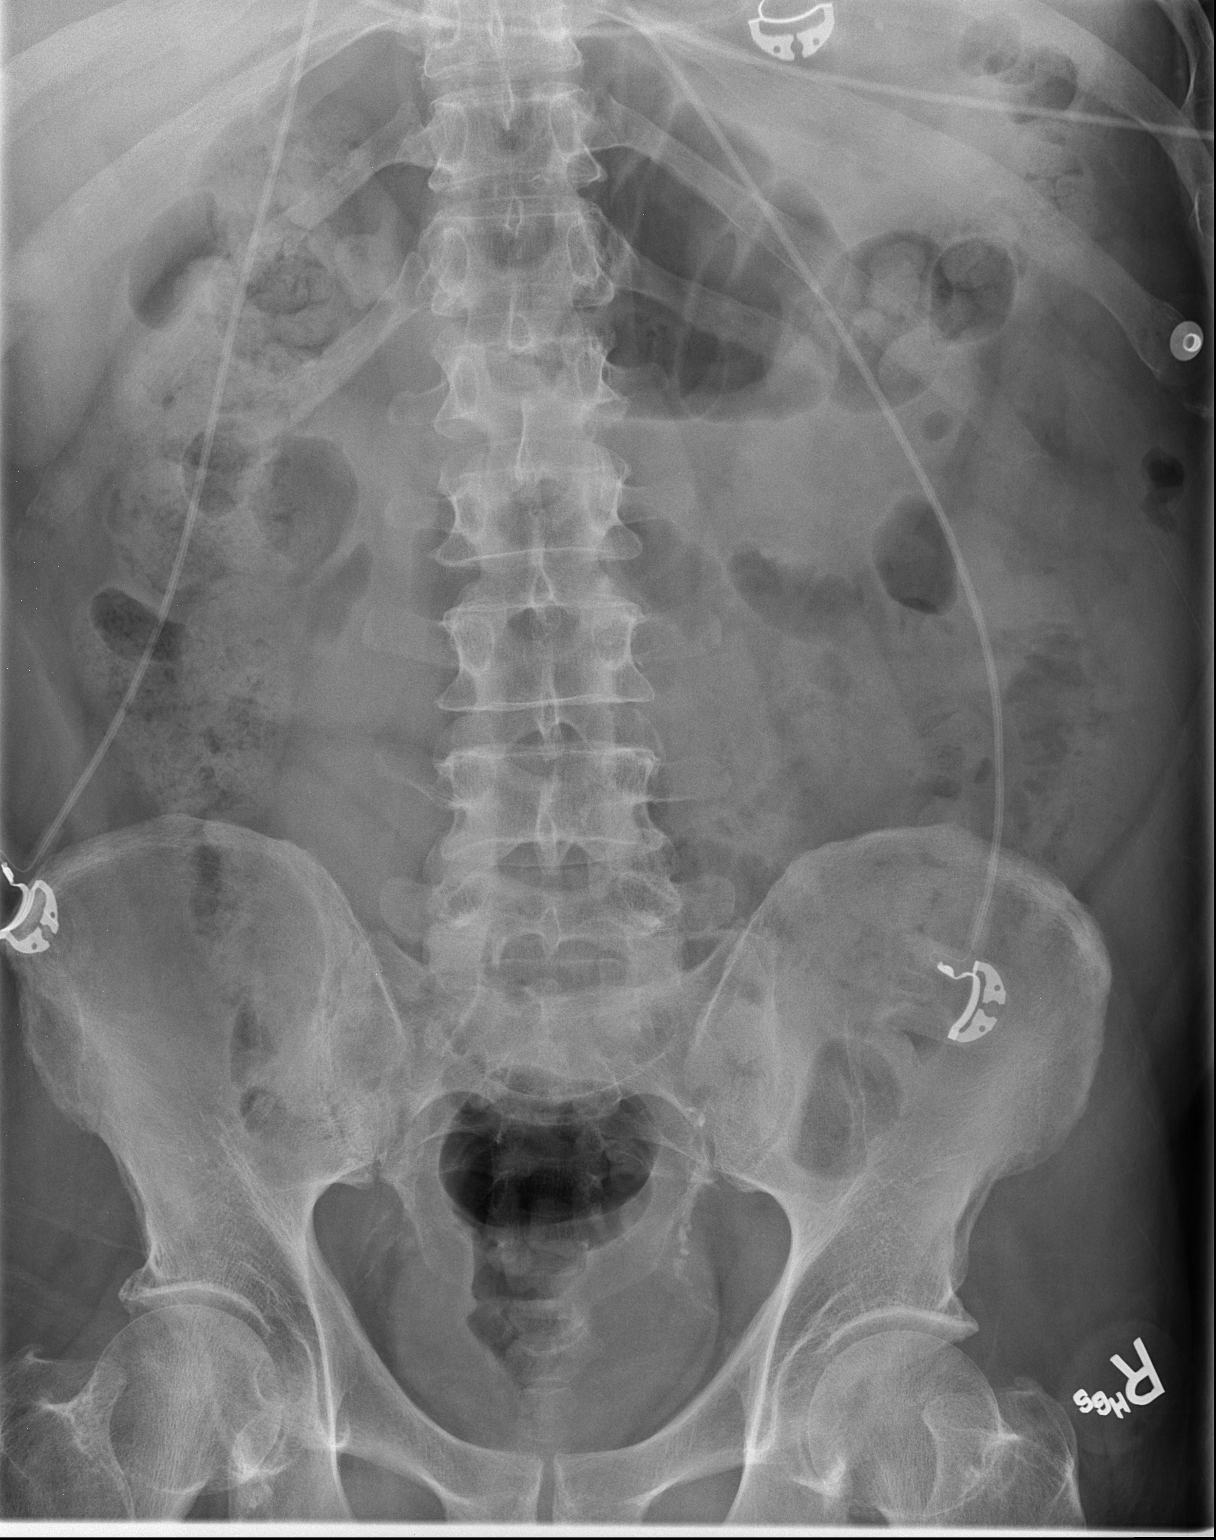

[1 of 1 positions shown; findings below may reference images not displayed]

FINDINGS: No radiopaque foreign bodies identified within the abdomen or
pelvis.
IMPRESSION: No radiopaque foreign bodies noted.

## 2016-12-05 DEATH — deceased

## 2017-11-03 IMAGING — DX DG CHEST 1V PORT
1 series · 1 of 1 positions shown · non-contrast
Comparison: 08/03/2016

CLINICAL DATA: Post intubation

EXAM:
PORTABLE CHEST 1 VIEW

[chest ap]
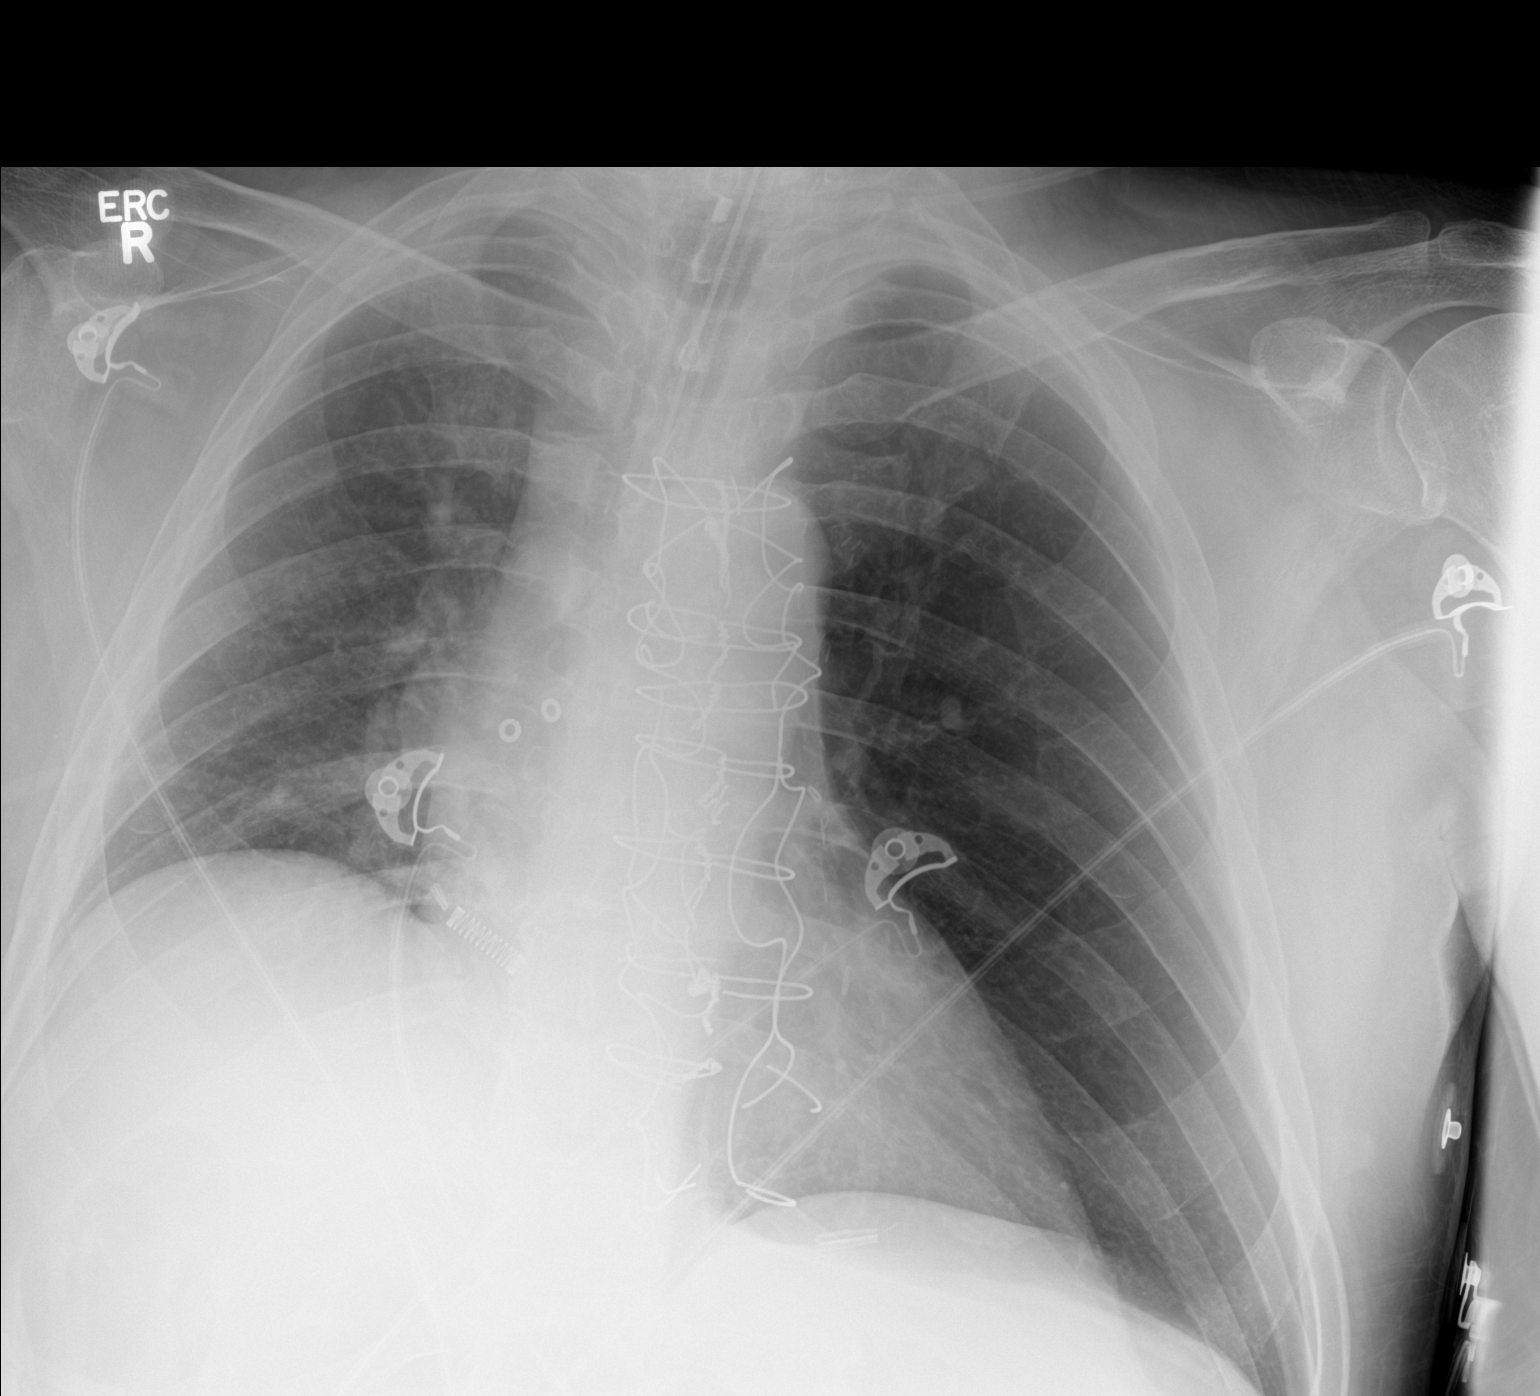

[1 of 1 positions shown; findings below may reference images not displayed]

FINDINGS: Interval placement of an endotracheal tube with tip measuring 3.7 cm
above the carina. Postoperative changes in the mediastinum.
Elevation of the right hemidiaphragm. Asymmetrical decreased size of
the right hemi thorax in respect the left is likely due to a
combination of patient rotation, atelectasis on the right, and
compensatory hyperinflation of the left. No focal consolidation. No
blunting of costophrenic angles. No pneumothorax. Mediastinal
contours appear intact.
IMPRESSION: Endotracheal tube placed with tip measuring 3.7 cm above the carina.
Elevation of the right hemi diaphragm with probable atelectasis in
the right lung base appear

## 2017-11-03 IMAGING — DX DG CHEST 1V PORT
1 series · 1 of 1 positions shown · non-contrast
Comparison: Radiograph September 02, 2015.

CLINICAL DATA: Altered mental status.

EXAM:
PORTABLE CHEST 1 VIEW

[chest ap]
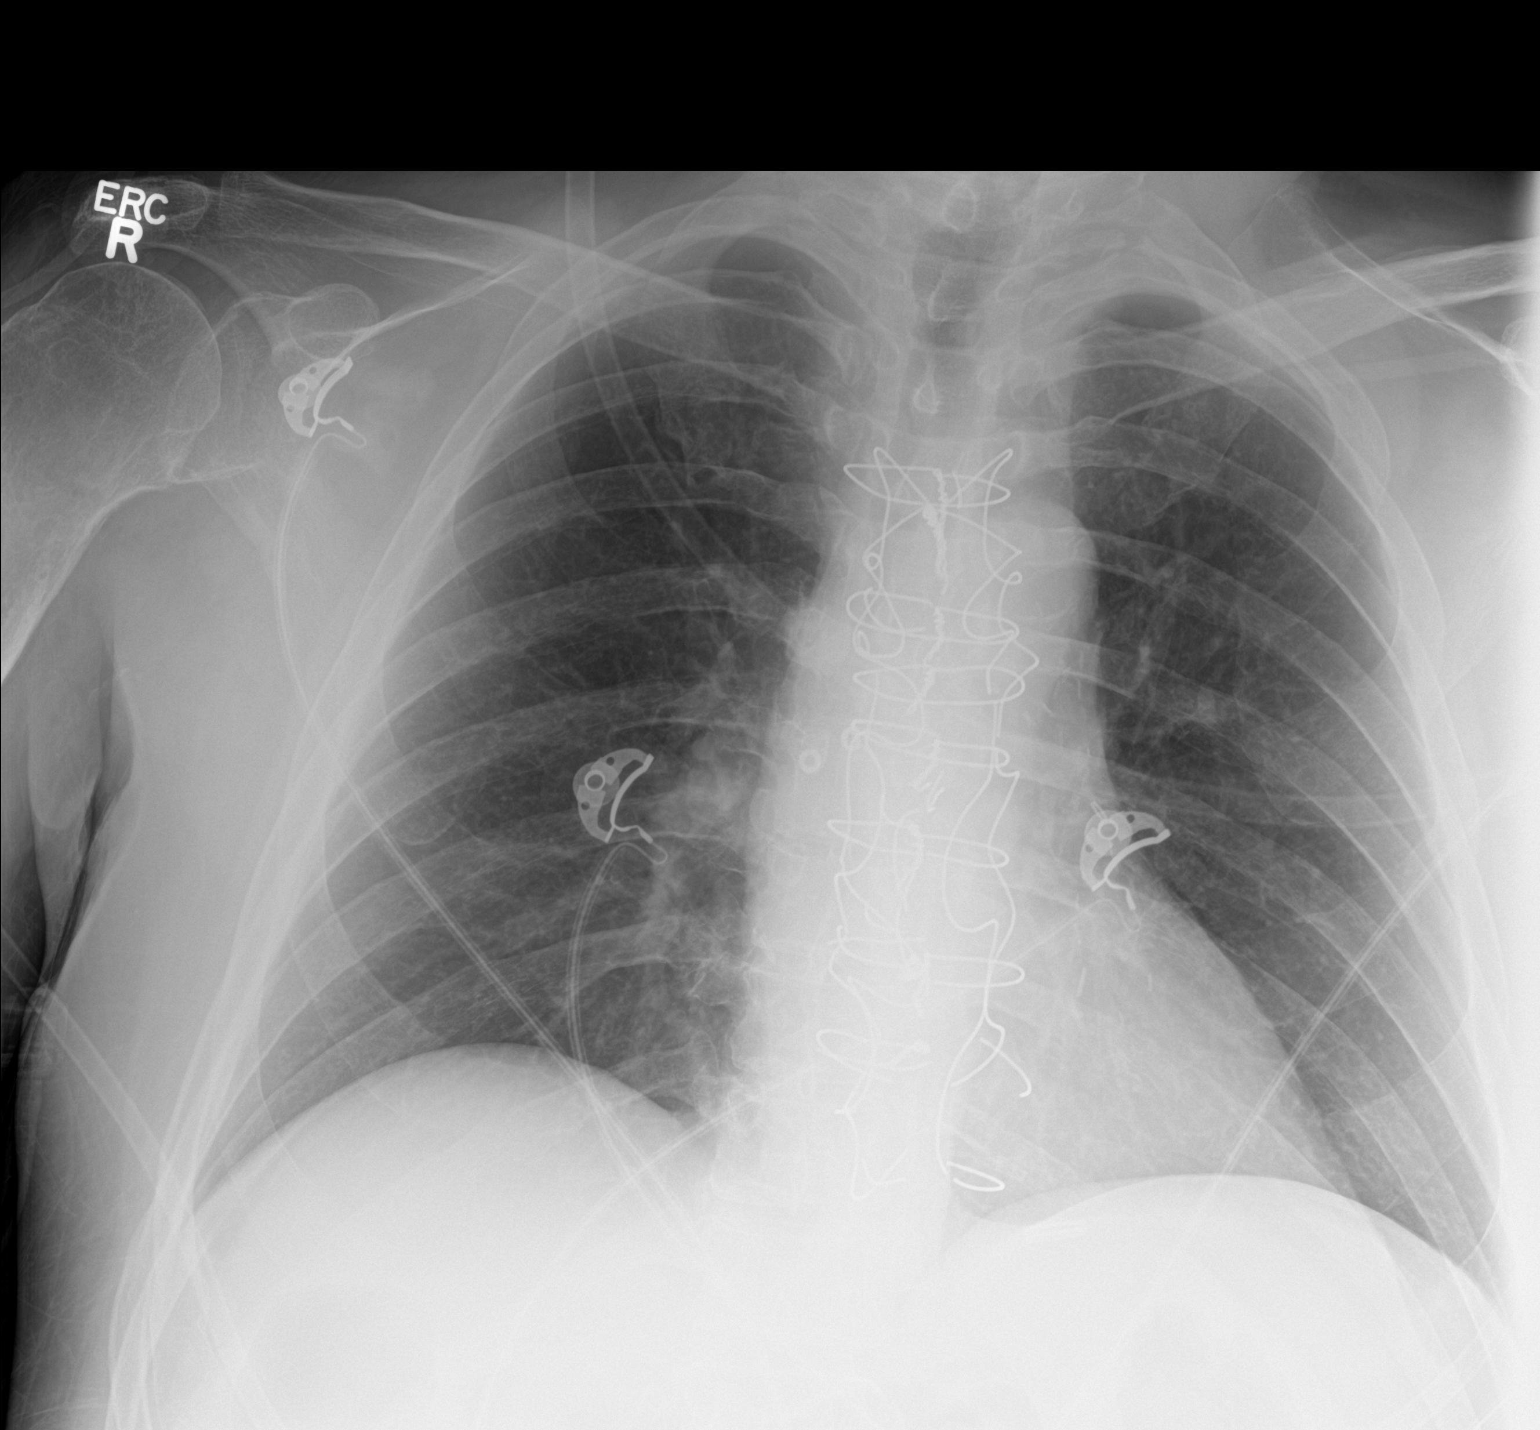

[1 of 1 positions shown; findings below may reference images not displayed]

FINDINGS: The heart size and mediastinal contours are within normal limits.
Both lungs are clear. No pneumothorax or pleural effusion is noted.
Sternotomy wires are noted. The visualized skeletal structures are
unremarkable.
IMPRESSION: No acute cardiopulmonary abnormality seen.
